# Patient Record
Sex: Female | Born: 1970
Health system: Southern US, Community
[De-identification: ages and names within clinical notes are randomized; demographics above are authoritative.]

## PROBLEM LIST (undated history)

## (undated) DIAGNOSIS — J309 Allergic rhinitis, unspecified: Secondary | ICD-10-CM

## (undated) DIAGNOSIS — D649 Anemia, unspecified: Secondary | ICD-10-CM

## (undated) DIAGNOSIS — E079 Disorder of thyroid, unspecified: Secondary | ICD-10-CM

## (undated) DIAGNOSIS — Z973 Presence of spectacles and contact lenses: Secondary | ICD-10-CM

## (undated) DIAGNOSIS — H9312 Tinnitus, left ear: Secondary | ICD-10-CM

## (undated) DIAGNOSIS — F410 Panic disorder [episodic paroxysmal anxiety] without agoraphobia: Secondary | ICD-10-CM

## (undated) DIAGNOSIS — E559 Vitamin D deficiency, unspecified: Secondary | ICD-10-CM

## (undated) DIAGNOSIS — N920 Excessive and frequent menstruation with regular cycle: Secondary | ICD-10-CM

## (undated) DIAGNOSIS — D500 Iron deficiency anemia secondary to blood loss (chronic): Secondary | ICD-10-CM

## (undated) DIAGNOSIS — K219 Gastro-esophageal reflux disease without esophagitis: Secondary | ICD-10-CM

## (undated) DIAGNOSIS — F411 Generalized anxiety disorder: Secondary | ICD-10-CM

## (undated) DIAGNOSIS — R030 Elevated blood-pressure reading, without diagnosis of hypertension: Secondary | ICD-10-CM

## (undated) DIAGNOSIS — U071 COVID-19: Secondary | ICD-10-CM

## (undated) DIAGNOSIS — F419 Anxiety disorder, unspecified: Secondary | ICD-10-CM

## (undated) DIAGNOSIS — E039 Hypothyroidism, unspecified: Secondary | ICD-10-CM

## (undated) DIAGNOSIS — H9319 Tinnitus, unspecified ear: Secondary | ICD-10-CM

## (undated) DIAGNOSIS — D259 Leiomyoma of uterus, unspecified: Secondary | ICD-10-CM

## (undated) HISTORY — DX: Gastro-esophageal reflux disease without esophagitis: K21.9

## (undated) HISTORY — PX: CERVICAL CERCLAGE: SHX1329

## (undated) HISTORY — DX: Anemia, unspecified: D64.9

## (undated) HISTORY — PX: BREAST BIOPSY: SHX20

## (undated) HISTORY — DX: Vitamin D deficiency, unspecified: E55.9

## (undated) HISTORY — DX: Tinnitus, unspecified ear: H93.19

---

## 1997-11-23 ENCOUNTER — Other Ambulatory Visit: Admission: RE | Admit: 1997-11-23 | Discharge: 1997-11-23 | Payer: Self-pay | Admitting: *Deleted

## 1997-12-24 ENCOUNTER — Other Ambulatory Visit: Admission: RE | Admit: 1997-12-24 | Discharge: 1997-12-24 | Payer: Self-pay | Admitting: Internal Medicine

## 1998-05-20 ENCOUNTER — Other Ambulatory Visit: Admission: RE | Admit: 1998-05-20 | Discharge: 1998-05-20 | Payer: Self-pay | Admitting: *Deleted

## 1999-05-23 ENCOUNTER — Other Ambulatory Visit: Admission: RE | Admit: 1999-05-23 | Discharge: 1999-05-23 | Payer: Self-pay | Admitting: *Deleted

## 2000-08-29 ENCOUNTER — Other Ambulatory Visit: Admission: RE | Admit: 2000-08-29 | Discharge: 2000-08-29 | Payer: Self-pay | Admitting: Obstetrics and Gynecology

## 2000-09-20 ENCOUNTER — Ambulatory Visit (HOSPITAL_COMMUNITY): Admission: RE | Admit: 2000-09-20 | Discharge: 2000-09-20 | Payer: Self-pay | Admitting: Obstetrics and Gynecology

## 2000-09-20 ENCOUNTER — Encounter: Payer: Self-pay | Admitting: Obstetrics and Gynecology

## 2000-11-01 ENCOUNTER — Ambulatory Visit (HOSPITAL_COMMUNITY): Admission: RE | Admit: 2000-11-01 | Discharge: 2000-11-01 | Payer: Self-pay | Admitting: Obstetrics and Gynecology

## 2000-12-14 ENCOUNTER — Encounter (INDEPENDENT_AMBULATORY_CARE_PROVIDER_SITE_OTHER): Payer: Self-pay | Admitting: Specialist

## 2000-12-14 ENCOUNTER — Inpatient Hospital Stay (HOSPITAL_COMMUNITY): Admission: AD | Admit: 2000-12-14 | Discharge: 2001-01-17 | Payer: Self-pay | Admitting: Obstetrics and Gynecology

## 2000-12-19 ENCOUNTER — Encounter: Payer: Self-pay | Admitting: Obstetrics and Gynecology

## 2000-12-26 ENCOUNTER — Encounter: Payer: Self-pay | Admitting: Obstetrics and Gynecology

## 2001-01-03 ENCOUNTER — Encounter: Payer: Self-pay | Admitting: Obstetrics & Gynecology

## 2001-01-13 ENCOUNTER — Encounter: Payer: Self-pay | Admitting: Obstetrics and Gynecology

## 2001-01-18 ENCOUNTER — Encounter: Admission: RE | Admit: 2001-01-18 | Discharge: 2001-02-17 | Payer: Self-pay | Admitting: Obstetrics and Gynecology

## 2001-02-12 ENCOUNTER — Other Ambulatory Visit: Admission: RE | Admit: 2001-02-12 | Discharge: 2001-02-12 | Payer: Self-pay | Admitting: Obstetrics and Gynecology

## 2001-02-18 ENCOUNTER — Encounter: Admission: RE | Admit: 2001-02-18 | Discharge: 2001-03-20 | Payer: Self-pay | Admitting: Obstetrics and Gynecology

## 2001-04-20 ENCOUNTER — Encounter: Admission: RE | Admit: 2001-04-20 | Discharge: 2001-05-20 | Payer: Self-pay | Admitting: Obstetrics and Gynecology

## 2001-05-21 ENCOUNTER — Encounter: Admission: RE | Admit: 2001-05-21 | Discharge: 2001-06-20 | Payer: Self-pay | Admitting: Obstetrics and Gynecology

## 2002-02-14 ENCOUNTER — Other Ambulatory Visit: Admission: RE | Admit: 2002-02-14 | Discharge: 2002-02-14 | Payer: Self-pay | Admitting: Internal Medicine

## 2003-09-03 ENCOUNTER — Encounter: Admission: RE | Admit: 2003-09-03 | Discharge: 2003-09-03 | Payer: Self-pay | Admitting: Internal Medicine

## 2009-11-24 ENCOUNTER — Encounter: Admission: RE | Admit: 2009-11-24 | Discharge: 2009-11-24 | Payer: Self-pay | Admitting: Internal Medicine

## 2010-10-02 ENCOUNTER — Encounter: Payer: Self-pay | Admitting: Internal Medicine

## 2010-10-03 ENCOUNTER — Encounter: Payer: Self-pay | Admitting: Internal Medicine

## 2011-01-04 ENCOUNTER — Other Ambulatory Visit: Payer: Self-pay | Admitting: Internal Medicine

## 2011-01-04 DIAGNOSIS — Z1231 Encounter for screening mammogram for malignant neoplasm of breast: Secondary | ICD-10-CM

## 2011-01-26 ENCOUNTER — Ambulatory Visit: Payer: Self-pay

## 2011-01-27 NOTE — Op Note (Signed)
[facility]  PatientMADGELINE, [name]                 MRN: [id number] Proc. Date: 11/01/00 Adm. Date:  [id number] Disc. Date: [id number] Attending:  Maxie Better                           Operative Report  PREOPERATIVE DIAGNOSES:       Cervical incompetence, intrauterine gestation at                               13-5/7th weeks.  POSTOPERATIVE DIAGNOSES:      Cervical incompetence, intrauterine gestation at                               13-5/17ht weeks.  OPERATION:                    Shirodkar cerclage.  SURGEON:                      Sheronette A. Cherly Hensen, M.D.  ASSISTANT:                    Marina Gravel, M.D.  ANESTHESIA:                   Spinal.  INDICATIONS:                  This is a 40 year old gravida 2, para 0-0-1-0 female with an EDC by ultrasound of May 04, 2001 now at 13-5/7th weeks gestation being admitted for a cerclage placement secondary to cervical incompetence. Cervical incompetence had been diagnosed after the patient had a 2nd trimester loss.  Her history is notable for cryosurgery.  She also has the history of hypothyroidism which is well controlled.  The patient had a group B strep culture done prior to her procedure and this was negative.  She however had a history of a positive group B strep with her previous pregnancy. The risks and benefits of the procedure explained to the patient and consent was signed.  Fetal heart rhythms were auscultated prior to transfer to the operating room.  DESCRIPTION OF PROCEDURE:  Under adequate spinal anesthesia, the patient was placed in the dorsal lithotomy position using the Allen stirrups.  The patient was sterilely prepped and draped and the bladder was catheterized for a large amount of urine. A bivalve speculum was placed in the vagina and the vagina was then prepped with saline solution.  The cervix was notable for the less than a centimeter in length round os with  some granulation tissue around 10 oclock. Using a weighted speculum the cervix was then subsequently grasped by the anterior and posterior lip at the junction of the cervix and the vagina as well as the cervix and the rectum.  An injection of normal saline fluid was used to help to delineate the reflection of the vagina to the bladder area.  A transverse incision was then made at the junction of the cervix and the vagina and was then initially attempted to be displaced superiorly using blunt dissection, however, a Metzenbaum was subsequently used to cause advancement of the cervical vaginal junction anteriorly.  This was also performed posteriorly as well.  After adequate further lengthening of the cervix had  been accomplished with this method, a 5 mm Mersilene tape was placed starting at 10 oclock and in the U-form with a knot being placed anteriorly and a Prolene suture used to help to mark the area of the knot.  The cervix was noted to be closed.  Good cervical length was noted after the procedure.  The patient received ampicillin  perioperatively. The patient tolerated the procedure well and was transferred to the recovery room in stable condition. In the recovery room she received Indomethacin by mouth and fetal heart rate of 168 was noted.  The patient will be discharged home with some ampicillin and Indocin treatment.  Follow-up is in one week at the office.  SPECIMENS:                    None.  COMPLICATIONS:                None. DD:  11/01/00 TD:  11/03/00 Job: [id number]

## 2011-01-27 NOTE — Discharge Summary (Signed)
[facility]  PatientSHERRIKA, [name]                 MRN: [id number] Adm. Date:  [id number] Disc. Date: [id number] Attending:  Maxie Better                           Discharge Summary  ADMISSION DIAGNOSES:          1. Cervical incompetence.                               2. Intrauterine gestation at 19-6/7 weeks.                               3. Hypothyroidism.  DISCHARGE DIAGNOSES:          1. Severe chorioamnionitis.                               2. Preterm gestation, delivered.                               3. Preterm premature rupture of membranes.                               4. Cervical incompetence.                               5. Hypothyroidism.                               6. Iron deficiency anemia.  PROCEDURE:                    Classical cesarean section.  HISTORY OF PRESENT ILLNESS:   This is a 40 year old, gravida 2, para 0-0-1-0, female, at 19-6/[redacted] weeks gestation by ultrasound with a Shirodkar cerclage, admitted for bed rest secondary to severe shortening of the cervix due to cervical incompetence.  The patients history had been notable for a second trimester loss at 21 weeks.  She had a cerclage placement done on November 01, 2000, with followup ultrasound subsequent to the procedure revealing a cervical length of 3.1 cm.  The patient presented for routine obstetrical care and a repeat sonogram showed an internal os dilated to the level of the cerclage and the cervical length of 0.8 cm.  The patients history is notable for cryosurgery.  No history of DES exposure and the antiphospholipid antibody evaluation prior to pregnancy was negative.  The patient had had no contractions and noted good fetal movement.  Anatomic survey had been otherwise normal.  HOSPITAL COURSE:              The patient was admitted with a diagnosis of severely shortened cervix despite cerclage placement.  She was placed in Trendelenburg  position and bed rest.  She was continued on her Synthroid medication.  Urinalysis was obtained which was unremarkable.  The patient was monitored for possible uterine contractions.  There was evidence of rare uterine irritability, however, no contractions noted.  She was discontinued on the continuous monitoring, placed  for contractions q.d. for an hour and continued in Trendelenburg.  She underwent an anatomic ultrasound on December 19, 2000, that had showed a cervical length of 0.8 cm.  Fetal position limited the ability to see the orbits of the face and the outflow tract.  The patient had continued close monitoring and was placed on expectant management.  Fetal heart rate was auscultated generally between 148-152.  The patient had physical therapy consultation due to the prolonged bed rest.  She had a repeat ultrasound to check cervical length on April 17, that showed the cervical length at 1.0 cm.  The cerclage suture was noted and an active fetus.  ______ examination had shown that suture but a minimally palpable cervical length. The patient was consensually continued on this management until hospital day #31.  The day prior on Jan 12, 2001, the patient had noted some contractions which responded to an intravenous fluid bolus.  On Jan 13, 2001, the patient had preterm premature rupture of membranes.  Ultrasound revealed a funic presentation with breech.  The patient was complaining of intermittent low-back pain.  She was afebrile.  Abdomen was gravid, nontender.  Sterile speculum examination revealed a copious fluid, greenish without odor.  Suture was visible.  The cervix appeared not to be open.  Fern positive.  Urinalysis was obtained.  A CBC with differential was also obtained for her possible chorioamnionitis.  The patient was given one dose of betamethasone. Ultrasound had revealed an estimated fetal weight of 78 g which is in the 53% on cor presenting, anterior placenta.  The  initial plan had been to leave the cerclage in place with the ruptured membrane and watch closely for evidence of infection.  However, her CBC revealed a white count of 22.9 with 83 polys, 19 lymphs.  Urine culture was done and subsequently was negative with urine that showed trace blood, nitrite negative and ketones negative.  Her CBC from February of 2002 had showed a white count of 15.1.  The patient was transferred to labor and delivery for close monitoring.  She still had remained afebrile at that time.  On palpation with a digital examination, the cord was palpable.  The pull-through suture of the cerclage breech which required an emergent cesarean section.  The patient was transferred to the operating room where she underwent a classical cesarean section and removal of her cerclage.  Anterior placenta was noted.  A live female was delivered, breech position, Apgars of 4, 6, and 7.  Normal tubes and ovaries were noted.  The placenta was spontaneous, intact with a final pathology consistent with severe chorioamnionitis.  The cervix was inspected and no lacerations noted.  The baby was transferred in ICU.  The weight of the baby was 659 grams.  The patient was transferred to the postpartum floor.  She remained afebrile throughout her postoperative course.  Her CBC on postop day #1 showed a hemoglobin of 10.1, hematocrit of 29.4, white count was 25.7.  Repeat CBC on postop day #3 showed a white count down to 16.1, hemoglobin 7.9, hematocrit of 23.  The patient had been asymptomatic despite the low hemoglobin.  By postop day #4 a repeat CBC showed a hemoglobin of 8.7, hematocrit of 25.9, a white count of 16.6.  The patient remained without headache or dizziness.  Her incision was without any erythema, induration or exudate.  She was tolerating a regular diet and had remained afebrile, and therefore deemed well to be discharged home.  DISPOSITION:  Home.  CONDITION ON  DISCHARGE:       Stable.  DISCHARGE MEDICATIONS:        1. Prenatal vitamins 1 p.o. q.d.                               2. Iron supplementation 25 mg 1 p.o. b.i.d.                                3. Tylox 1-2 tablets q.3-4h. p.r.n. pain.                               4. Motrin 600 mg 1 q.6h. p.r.n. pain.  DISCHARGE INSTRUCTIONS:       Call for temperature greater or equal to 100.4. Nothing per vagina for 4-6 weeks.  No heavy lifting nor driving for 2 weeks. Call if incisional drainage, redness, increased incisional pain, severe abdominal pain, nausea or vomiting.  Baby remains in the NICU.  Followup appointment is at 4 weeks at [facility]. DD:  02/20/01 TD:  02/20/01 Job: [id number]

## 2011-01-27 NOTE — Op Note (Signed)
[facility]  PatientDAUN, RENS                 MRN: [id number] Proc. Date: 01/13/01 Adm. Date:  [id number] Attending:  Maxie Better                           Operative Report  PREOPERATIVE DIAGNOSES:         1. Preterm premature rupture of membranes.                                 2. Cervical incompetence.                                 3. Breech/funic presentation.                                 4. Intrauterine gestation at 24-1/7                                    weeks.  POSTOPERATIVE DIAGNOSES:        1. Preterm premature rupture of membranes.                                 2. Intrauterine gestation at 24-1/7 weeks.                                 3. Cervical incompetence.                                 4. Breech/funic presentation.  OPERATION:                      Primary cesarean section, classical hysterotomy, removal of cerclage.  SURGEON:                        Sheronette A. Cherly Hensen, M.D.  ASSISTANT:                      Jamey Reas, M.D.  ANESTHESIA:                     General.  INDICATIONS:                    This is a 40 year old gravida 2, para 1-0-1-0 at 0-0-1-0 female 24-1/[redacted] weeks gestation with a cerclage admitted to the hospital since December 14, 2000, secondary to severe shortening of the her cervix with the cerclage in situ who had spontaneous rupture of membranes at 5:10 on Jan 13, 2001.  The patient was evaluated, confirmed rupture of membranes with fern testing.  An ultrasound was done at the bedside by the radiology department and showed a breech presentation with cord presenting, and cerclage in the cervix at approximately 1 cm.  The estimated fetal weight was 670 g.  Anterior placenta.  The patient had been complaining of some low back pain and was felt  to be having some contractions.  She had been afebrile. Abdomen was gravid and nontender.  Tracing did not show the  contractions. Straight catheterization urinalysis was obtained which showed only trace blood.  The CBC with differential was obtained which showed a white count of 22.5 with 83 polys and 11 lymphs.  Group B strep culture which has been negative has been negative at the time of her cerclage placement was reperformed.  The patient was restarted on magnesium sulfate, given one dose of betamethasone and was transferred to labor and delivery with the plan to have magnesium sulfate until the completion of her betamethasone and to watch for evidence of chorioamnionitis.  On transfer to labor and delivery, the patient was examined by digital and was noted to have pull through of the cerclage with a palpable cord and fetal part.  Based on the findings, decision was made to proceed with a primary cesarean section.  The patient had been seen by the neonatologist at around 23+ weeks and it was fully understand the implications of her severe prematurity.  Risks and benefits of the cesarean section have been discussed with the patient and the patient was emergently transferred to the operating room.  DESCRIPTION OF PROCEDURE:     Under adequate epidural anesthesia, the patient was placed in the supine position with a left lateral tilt.  An indwelling Foley catheter had been in place prior.  The patient was prepped and draped with the induction of general anesthesia.  A Pfannenstiel incision was then quickly made, carried down to the rectus fascia.  The rectus fascia was incised in the midline and extended bilaterally.  The rectus fascia was then bluntly and sharply dissected off the rectus muscles in a superior and inferior fashion.  The rectus muscle were split in the midline.  The parietal peritoneum was bluntly entered.  A vertical uterine incision was then made and subsequent delivery of a live female infant in the breech position was accomplished.  The cord was clamped and cut.  The baby was  transferred to the awaiting pediatrician who quickly transferred the baby to the neonatal intensive care unit.  Apgars of 4, 6 and 7 were subsequently assigned at 1, 5 and 10 minutes.  The placenta was spontaneously intact and sent to pathology. The uterine cavity was cleaned of debris.  The uterus was exteriorized. Normal tubes and ovaries were noted bilaterally.  It was notable for the uterus to be atonic with very thin musculature.  The uterine cavity was cleaned of debris.  The uterine incision was closed with a running locked stitch of 0 Monocryl.  The second layer was imbricated using 0 Monocryl and 2-0 Monocryl was used to do baseball closure of the serosa.  Inferiorly, there was evidence of bleed, and the figure-of-eight suture was placed.  The uterus was then returned to the abdomen and pericolic gutters were cleaned. Inspection of the incision showed good hemostasis.  The parietal peritoneum was closed.  The rectus fascia was inspected.  Small bleeders were cauterized. The rectus fascia was closed with Vicryl x 2.  The subcutaneous areas were irrigated.  Small bleeders were cauterized and subcuticular areas injected with 0.25% Marcaine and approximated using Ethicon staples.  Attention was then turned to the vagina.  The patient was placed in the [place] stirrups.  A large Graves speculum was placed in the vagina.  The blood was removed from the vagina.  The anterior knot of the Mersilene band was identified and cut and  removed.  The cervix appeared to be at least 2 cm dilated and using a ring clamp, the cervix was serially inspected.  No evidence of a laceration was noted, and therefore a large clot was removed from the os and the instruments were removed from the vagina.  Specimen:  Placenta sent to pathology. Estimated blood loss was 800 cc.  Urine output was 400 cc clear, yellow urine, Intraoperative blood loss was 2 liters.  The baby was transferred to the intensive care unit.   Mom was transferred to the recovery room stable. Sponge and instrument counts x 2 were correct.  Complications none. DD:  01/13/01 TD:  01/14/01 Job: [id number]

## 2011-04-13 ENCOUNTER — Ambulatory Visit
Admission: RE | Admit: 2011-04-13 | Discharge: 2011-04-13 | Disposition: A | Discharge status: Home / Self Care | Payer: BC Managed Care – PPO | Source: Ambulatory Visit | Attending: Internal Medicine | Admitting: Internal Medicine

## 2011-04-13 DIAGNOSIS — Z1231 Encounter for screening mammogram for malignant neoplasm of breast: Secondary | ICD-10-CM

## 2011-10-27 ENCOUNTER — Encounter (HOSPITAL_BASED_OUTPATIENT_CLINIC_OR_DEPARTMENT_OTHER): Payer: Self-pay | Admitting: *Deleted

## 2011-10-27 ENCOUNTER — Other Ambulatory Visit: Payer: Self-pay

## 2011-10-27 ENCOUNTER — Emergency Department (HOSPITAL_BASED_OUTPATIENT_CLINIC_OR_DEPARTMENT_OTHER)
Admission: EM | Admit: 2011-10-27 | Discharge: 2011-10-28 | Disposition: A | Discharge status: Home / Self Care | Payer: BC Managed Care – PPO | Attending: Emergency Medicine | Admitting: Emergency Medicine

## 2011-10-27 DIAGNOSIS — E079 Disorder of thyroid, unspecified: Secondary | ICD-10-CM | POA: Insufficient documentation

## 2011-10-27 DIAGNOSIS — F411 Generalized anxiety disorder: Secondary | ICD-10-CM | POA: Insufficient documentation

## 2011-10-27 DIAGNOSIS — F419 Anxiety disorder, unspecified: Secondary | ICD-10-CM

## 2011-10-27 DIAGNOSIS — R0789 Other chest pain: Secondary | ICD-10-CM | POA: Insufficient documentation

## 2011-10-27 HISTORY — DX: Panic disorder (episodic paroxysmal anxiety): F41.0

## 2011-10-27 HISTORY — DX: Anxiety disorder, unspecified: F41.9

## 2011-10-27 HISTORY — DX: Disorder of thyroid, unspecified: E07.9

## 2011-10-27 NOTE — ED Notes (Signed)
Palpatations x 4 years. Pain in the left side of her neck x 1 week. States she has felt anxious all day today.

## 2011-10-28 LAB — CARDIAC PANEL(CRET KIN+CKTOT+MB+TROPI)
CK, MB: 1.6 ng/mL (ref 0.3–4.0)
Relative Index: 1.2 (ref 0.0–2.5)
Total CK: 134 U/L (ref 7–177)

## 2011-10-28 LAB — DIFFERENTIAL
Basophils Absolute: 0 10*3/uL (ref 0.0–0.1)
Eosinophils Absolute: 0.3 10*3/uL (ref 0.0–0.7)
Eosinophils Relative: 3 % (ref 0–5)
Lymphocytes Relative: 31 % (ref 12–46)
Neutrophils Relative %: 56 % (ref 43–77)

## 2011-10-28 LAB — BASIC METABOLIC PANEL
CO2: 26 mEq/L (ref 19–32)
Calcium: 9.9 mg/dL (ref 8.4–10.5)
Creatinine, Ser: 0.8 mg/dL (ref 0.50–1.10)
GFR calc non Af Amer: >90 mL/min (ref >90)
Sodium: 137 mEq/L (ref 135–145)

## 2011-10-28 LAB — CBC
MCH: 29.7 pg (ref 26.0–34.0)
MCV: 86.2 fL (ref 78.0–100.0)
Platelets: 316 10*3/uL (ref 150–400)
RBC: 4.27 MIL/uL (ref 3.87–5.11)
RDW: 12.6 % (ref 11.5–15.5)
WBC: 9.6 10*3/uL (ref 4.0–10.5)

## 2011-10-28 MED ORDER — ACETAMINOPHEN 325 MG PO TABS
ORAL_TABLET | ORAL | Status: AC
  Administered 2011-10-28: 650 mg
  Filled 2011-10-28: qty 2

## 2011-10-28 MED ORDER — LORAZEPAM 1 MG PO TABS: 1.0000 mg | ORAL_TABLET | Freq: Three times a day (TID) | ORAL | Status: AC | PRN

## 2011-10-28 NOTE — ED Provider Notes (Signed)
History     CSN: [id number]  Arrival date & time 10/27/11  2248   First MD Initiated Contact with Patient 10/28/11 0041      Chief Complaint  Patient presents with  . Chest tightness and anxiety     (Consider location/radiation/quality/duration/timing/severity/associated sxs/prior treatment) HPI This is a 41 year old black female with a history of anxiety. She also is a 4 year history of hearing her heart beating in her left ear. She is here complaining of episodic anxiety with chest tightness throughout the day yesterday. There did not seem to be any exacerbating or mitigating factors. It was not worse with exertion nor improved with rest. She denies hyperventilation, paresthesias or frank chest pain. She thought it might be indigestion at first. It was associated with an increase in the left ear tinnitus. The left ear tinnitus returned to its baseline level about half an hour ago. While he was worse she was having difficulty hearing. Her symptoms peaked about 8 PM yesterday. At the present time she is asymptomatic.  Past Medical History  Diagnosis Date  . Anxiety   . Panic attack   . Thyroid disease     History reviewed. No pertinent past surgical history.  No family history on file.  History  Substance Use Topics  . Smoking status: Never Smoker   . Smokeless tobacco: Not on file  . Alcohol Use: No    OB History    Grav Para Term Preterm Abortions TAB SAB Ect Mult Living                  Review of Systems  All other systems reviewed and are negative.    Allergies  Review of patient's allergies indicates no known allergies.  Home Medications   Current Outpatient Rx  Name Route Sig Dispense Refill  . AMOXICILLIN-POT CLAVULANATE 875-125 MG PO TABS Oral Take 1 tablet by mouth 2 (two) times daily.    Marland Kitchen VITAMIN D 1000 UNITS PO TABS Oral Take 1,000 Units by mouth daily.    . ERYTHROMYCIN BASE 333 MG PO TBEC Oral Take 333 mg by mouth daily.    Marland Kitchen LEVOTHYROXINE SODIUM  25 MCG PO TABS Oral Take 25 mcg by mouth daily.    . ADULT MULTIVITAMIN W/MINERALS CH Oral Take 1 tablet by mouth daily.    Marland Kitchen PROBIOTIC FORMULA PO Oral Take 1 tablet by mouth daily.      BP 140/87  Pulse 89  Temp(Src) 97.9 F (36.6 C) (Oral)  Resp 22  SpO2 100%  Physical Exam General: Well-developed, well-nourished female in no acute distress; appearance consistent with age of record HENT: normocephalic, atraumatic; TMs of normal appearance Eyes: pupils equal round and reactive to light; extraocular muscles intact Neck: supple Heart: regular rate and rhythm; no murmurs Lungs: clear to auscultation bilaterally Abdomen: soft; nondistended; nontender; bowel sounds present Extremities: No deformity; full range of motion; pulses normal Neurologic: Awake, alert and oriented; motor function intact in all extremities and symmetric; no facial droop Skin: Warm and dry Psychiatric: Normal mood and affect    ED Course  Procedures (including critical care time)     MDM   Date: 10/27/2011  Rate: 84  Rhythm: normal sinus rhythm  QRS Axis: normal  Intervals: normal  ST/T Wave abnormalities: normal  Conduction Disutrbances: none  Narrative Interpretation:   Old EKG Reviewed: None available  Nursing notes and vitals signs, including pulse oximetry, reviewed.  Summary of this visit's results, reviewed by myself:  Labs:  Results for orders placed during the hospital encounter of 10/27/11  CARDIAC PANEL(CRET KIN+CKTOT+MB+TROPI)      Component Value Range   Total CK 134  7 - 177 (U/L)   CK, MB 1.6  0.3 - 4.0 (ng/mL)   Troponin I <0.30  <0.30 (ng/mL)   Relative Index 1.2  0.0 - 2.5   BASIC METABOLIC PANEL      Component Value Range   Sodium 137  135 - 145 (mEq/L)   Potassium 4.5  3.5 - 5.1 (mEq/L)   Chloride 102  96 - 112 (mEq/L)   CO2 26  19 - 32 (mEq/L)   Glucose, Bld 97  70 - 99 (mg/dL)   BUN 11  6 - 23 (mg/dL)   Creatinine, Ser 1.19  0.50 - 1.10 (mg/dL)   Calcium 9.9   8.4 - 10.5 (mg/dL)   GFR calc non Af Amer >90  >90 (mL/min)   GFR calc Af Amer >90  >90 (mL/min)  CBC      Component Value Range   WBC 9.6  4.0 - 10.5 (K/uL)   RBC 4.27  3.87 - 5.11 (MIL/uL)   Hemoglobin 12.7  12.0 - 15.0 (g/dL)   HCT 14.7  82.9 - 56.2 (%)   MCV 86.2  78.0 - 100.0 (fL)   MCH 29.7  26.0 - 34.0 (pg)   MCHC 34.5  30.0 - 36.0 (g/dL)   RDW 13.0  86.5 - 78.4 (%)   Platelets 316  150 - 400 (K/uL)  DIFFERENTIAL      Component Value Range   Neutrophils Relative 56  43 - 77 (%)   Neutro Abs 5.4  1.7 - 7.7 (K/uL)   Lymphocytes Relative 31  12 - 46 (%)   Lymphs Abs 3.0  0.7 - 4.0 (K/uL)   Monocytes Relative 10  3 - 12 (%)   Monocytes Absolute 0.9  0.1 - 1.0 (K/uL)   Eosinophils Relative 3  0 - 5 (%)   Eosinophils Absolute 0.3  0.0 - 0.7 (K/uL)   Basophils Relative 0  0 - 1 (%)   Basophils Absolute 0.0  0.0 - 0.1 (K/uL)   2:26 AM Asymptomatic. Symptomatology more consistent with anxiety. Patient was advised to return for worsening or changing symptoms.              Hanley Seamen, MD 10/28/11 [phone]

## 2011-10-28 NOTE — ED Notes (Signed)
Pt denies CP, SOB states she has mild tightness in her chest which she thinks is related to a panic attack

## 2011-10-28 NOTE — Discharge Instructions (Signed)

## 2012-04-30 ENCOUNTER — Other Ambulatory Visit: Payer: Self-pay | Admitting: Internal Medicine

## 2012-04-30 DIAGNOSIS — Z1231 Encounter for screening mammogram for malignant neoplasm of breast: Secondary | ICD-10-CM

## 2012-05-30 ENCOUNTER — Ambulatory Visit: Payer: BC Managed Care – PPO

## 2012-06-11 ENCOUNTER — Ambulatory Visit: Payer: BC Managed Care – PPO

## 2012-10-22 ENCOUNTER — Ambulatory Visit
Admission: RE | Admit: 2012-10-22 | Discharge: 2012-10-22 | Disposition: A | Discharge status: Home / Self Care | Payer: BC Managed Care – PPO | Source: Ambulatory Visit | Attending: Internal Medicine | Admitting: Internal Medicine

## 2012-10-22 DIAGNOSIS — Z1231 Encounter for screening mammogram for malignant neoplasm of breast: Secondary | ICD-10-CM

## 2012-12-17 ENCOUNTER — Ambulatory Visit: Payer: Self-pay | Admitting: Nurse Practitioner

## 2013-11-10 ENCOUNTER — Other Ambulatory Visit: Payer: Self-pay

## 2013-11-10 DIAGNOSIS — Z1231 Encounter for screening mammogram for malignant neoplasm of breast: Secondary | ICD-10-CM

## 2013-11-26 ENCOUNTER — Ambulatory Visit: Payer: BC Managed Care – PPO

## 2014-01-09 ENCOUNTER — Ambulatory Visit: Payer: BC Managed Care – PPO

## 2014-02-12 ENCOUNTER — Encounter (INDEPENDENT_AMBULATORY_CARE_PROVIDER_SITE_OTHER): Payer: Self-pay

## 2014-02-12 ENCOUNTER — Ambulatory Visit
Admission: RE | Admit: 2014-02-12 | Discharge: 2014-02-12 | Disposition: A | Discharge status: Home / Self Care | Payer: 59 | Source: Ambulatory Visit

## 2014-02-12 DIAGNOSIS — Z1231 Encounter for screening mammogram for malignant neoplasm of breast: Secondary | ICD-10-CM

## 2015-01-14 ENCOUNTER — Ambulatory Visit: Payer: 59 | Admitting: Neurology

## 2015-01-14 ENCOUNTER — Telehealth: Payer: Self-pay

## 2015-01-14 NOTE — Telephone Encounter (Signed)
Patient did not come to appt today

## 2015-01-25 ENCOUNTER — Encounter: Payer: Self-pay | Admitting: Neurology

## 2016-03-13 ENCOUNTER — Other Ambulatory Visit: Payer: Self-pay | Admitting: Internal Medicine

## 2016-03-13 DIAGNOSIS — Z1231 Encounter for screening mammogram for malignant neoplasm of breast: Secondary | ICD-10-CM

## 2016-03-22 ENCOUNTER — Ambulatory Visit
Admission: RE | Admit: 2016-03-22 | Discharge: 2016-03-22 | Disposition: A | Discharge status: Home / Self Care | Payer: BLUE CROSS/BLUE SHIELD | Source: Ambulatory Visit | Attending: Internal Medicine | Admitting: Internal Medicine

## 2016-03-22 DIAGNOSIS — Z1231 Encounter for screening mammogram for malignant neoplasm of breast: Secondary | ICD-10-CM

## 2016-03-27 ENCOUNTER — Other Ambulatory Visit: Payer: Self-pay | Admitting: Internal Medicine

## 2016-03-27 DIAGNOSIS — R928 Other abnormal and inconclusive findings on diagnostic imaging of breast: Secondary | ICD-10-CM

## 2016-03-31 ENCOUNTER — Other Ambulatory Visit: Payer: BLUE CROSS/BLUE SHIELD

## 2016-04-10 ENCOUNTER — Other Ambulatory Visit: Payer: Self-pay | Admitting: Internal Medicine

## 2016-04-10 ENCOUNTER — Ambulatory Visit
Admission: RE | Admit: 2016-04-10 | Discharge: 2016-04-10 | Disposition: A | Discharge status: Home / Self Care | Payer: BLUE CROSS/BLUE SHIELD | Source: Ambulatory Visit | Attending: Internal Medicine | Admitting: Internal Medicine

## 2016-04-10 DIAGNOSIS — N6310 Unspecified lump in the right breast, unspecified quadrant: Secondary | ICD-10-CM

## 2016-04-10 DIAGNOSIS — R928 Other abnormal and inconclusive findings on diagnostic imaging of breast: Secondary | ICD-10-CM

## 2016-04-14 ENCOUNTER — Other Ambulatory Visit: Payer: Self-pay | Admitting: Internal Medicine

## 2016-04-14 ENCOUNTER — Ambulatory Visit
Admission: RE | Admit: 2016-04-14 | Discharge: 2016-04-14 | Disposition: A | Discharge status: Home / Self Care | Payer: BLUE CROSS/BLUE SHIELD | Source: Ambulatory Visit | Attending: Internal Medicine | Admitting: Internal Medicine

## 2016-04-14 DIAGNOSIS — N6310 Unspecified lump in the right breast, unspecified quadrant: Secondary | ICD-10-CM

## 2016-04-14 DIAGNOSIS — N63 Unspecified lump in breast: Secondary | ICD-10-CM | POA: Diagnosis not present

## 2016-09-05 DIAGNOSIS — H16223 Keratoconjunctivitis sicca, not specified as Sjogren's, bilateral: Secondary | ICD-10-CM | POA: Diagnosis not present

## 2016-09-05 DIAGNOSIS — H527 Unspecified disorder of refraction: Secondary | ICD-10-CM | POA: Diagnosis not present

## 2016-12-04 ENCOUNTER — Encounter: Payer: Self-pay | Admitting: Family Medicine

## 2016-12-04 ENCOUNTER — Ambulatory Visit (INDEPENDENT_AMBULATORY_CARE_PROVIDER_SITE_OTHER): Payer: 59 | Admitting: Family Medicine

## 2016-12-04 VITALS — BP 118/66 | HR 86 | Temp 98.8°F | Resp 14 | Ht 62.0 in | Wt 221.0 lb

## 2016-12-04 DIAGNOSIS — K219 Gastro-esophageal reflux disease without esophagitis: Secondary | ICD-10-CM | POA: Diagnosis not present

## 2016-12-04 DIAGNOSIS — E039 Hypothyroidism, unspecified: Secondary | ICD-10-CM | POA: Diagnosis not present

## 2016-12-04 DIAGNOSIS — Z0000 Encounter for general adult medical examination without abnormal findings: Secondary | ICD-10-CM

## 2016-12-04 DIAGNOSIS — Z113 Encounter for screening for infections with a predominantly sexual mode of transmission: Secondary | ICD-10-CM

## 2016-12-04 DIAGNOSIS — H9319 Tinnitus, unspecified ear: Secondary | ICD-10-CM | POA: Insufficient documentation

## 2016-12-04 DIAGNOSIS — Z23 Encounter for immunization: Secondary | ICD-10-CM | POA: Diagnosis not present

## 2016-12-04 DIAGNOSIS — Z1321 Encounter for screening for nutritional disorder: Secondary | ICD-10-CM

## 2016-12-04 DIAGNOSIS — Z124 Encounter for screening for malignant neoplasm of cervix: Secondary | ICD-10-CM

## 2016-12-04 DIAGNOSIS — J3089 Other allergic rhinitis: Secondary | ICD-10-CM | POA: Diagnosis not present

## 2016-12-04 DIAGNOSIS — J309 Allergic rhinitis, unspecified: Secondary | ICD-10-CM | POA: Insufficient documentation

## 2016-12-04 DIAGNOSIS — E669 Obesity, unspecified: Secondary | ICD-10-CM | POA: Insufficient documentation

## 2016-12-04 DIAGNOSIS — H9313 Tinnitus, bilateral: Secondary | ICD-10-CM

## 2016-12-04 LAB — LIPID PANEL
CHOL/HDL RATIO: 2.4 ratio (ref <5.0)
CHOLESTEROL: 146 mg/dL (ref <200)
HDL: 61 mg/dL (ref >50)
LDL Cholesterol: 72 mg/dL (ref <100)
TRIGLYCERIDES: 64 mg/dL (ref <150)
VLDL: 13 mg/dL (ref <30)

## 2016-12-04 LAB — CBC WITH DIFFERENTIAL/PLATELET
BASOS ABS: 65 {cells}/uL (ref 0–200)
Basophils Relative: 1 %
EOS ABS: 195 {cells}/uL (ref 15–500)
Eosinophils Relative: 3 %
HEMATOCRIT: 37.9 % (ref 35.0–45.0)
Hemoglobin: 12.7 g/dL (ref 12.0–15.0)
LYMPHS PCT: 34 %
Lymphs Abs: 2210 cells/uL (ref 850–3900)
MCH: 29.5 pg (ref 27.0–33.0)
MCHC: 33.5 g/dL (ref 32.0–36.0)
MCV: 87.9 fL (ref 80.0–100.0)
MONO ABS: 650 {cells}/uL (ref 200–950)
MONOS PCT: 10 %
MPV: 9.5 fL (ref 7.5–12.5)
Neutro Abs: 3380 cells/uL (ref 1500–7800)
Neutrophils Relative %: 52 %
Platelets: 349 10*3/uL (ref 140–400)
RBC: 4.31 MIL/uL (ref 3.80–5.10)
RDW: 13.4 % (ref 11.0–15.0)
WBC: 6.5 10*3/uL (ref 3.8–10.8)

## 2016-12-04 LAB — COMPREHENSIVE METABOLIC PANEL
ALT: 18 U/L (ref 6–29)
AST: 17 U/L (ref 10–35)
Albumin: 3.8 g/dL (ref 3.6–5.1)
Alkaline Phosphatase: 57 U/L (ref 33–115)
BUN: 9 mg/dL (ref 7–25)
CALCIUM: 9.5 mg/dL (ref 8.6–10.2)
CHLORIDE: 106 mmol/L (ref 98–110)
CO2: 24 mmol/L (ref 20–31)
Creat: 0.8 mg/dL (ref 0.50–1.10)
GLUCOSE: 83 mg/dL (ref 70–99)
POTASSIUM: 4.4 mmol/L (ref 3.5–5.3)
Sodium: 139 mmol/L (ref 135–146)
Total Bilirubin: 0.4 mg/dL (ref 0.2–1.2)
Total Protein: 6.7 g/dL (ref 6.1–8.1)

## 2016-12-04 LAB — TSH: TSH: 0.93 m[IU]/L

## 2016-12-04 LAB — T4, FREE: Free T4: 1.1 ng/dL (ref 0.8–1.8)

## 2016-12-04 LAB — T3, FREE: T3, Free: 2.9 pg/mL (ref 2.3–4.2)

## 2016-12-04 LAB — WET PREP FOR TRICH, YEAST, CLUE
Clue Cells Wet Prep HPF POC: NONE SEEN
Trich, Wet Prep: NONE SEEN
YEAST WET PREP: NONE SEEN

## 2016-12-04 NOTE — Patient Instructions (Addendum)
Release of records- Dr. Garwin Brothers Release of records- [facility]- Dr. Arnette Norris I recommend eye visit once a year I recommend dental visit every 6 months Goal is to  Exercise 30 minutes 5 days a week We will call with results  Schedule mammogram for August F/U 6 months

## 2016-12-04 NOTE — Assessment & Plan Note (Signed)
Continue flonase and anti-histamines as needed

## 2016-12-04 NOTE — Assessment & Plan Note (Signed)
Discussed low carb, healthy fats Restart exercise program Check fasting labs and lipids today

## 2016-12-04 NOTE — Assessment & Plan Note (Signed)
Check thyroid function studies

## 2016-12-04 NOTE — Progress Notes (Signed)
Subjective:    Patient ID: [name], female    DOB: Feb 06, 1971, 46 y.o.   MRN: [id number]  Patient presents for New Patient CPE w/ PAP (is fasting) Patient here to establish care. Previous PCP- Dr. Baird [facility] GYN- Dr. Garwin Brothers  Due for PAP Smear    Chronic sinusitis - uses flonase,uses claritin or claritin d as needed    Hypothyrodism- history of Goiter, chronic tinnitus- has had MRI no concerns had seen ENTx3,  No hearing problems   No surgerical internvetion     Pregnant twice /lost of both children -The last one was at age 65 months about 15 years ago due to incompetent cervix and it seems like her body was rejecting the pregnancy. She still has regular menstrual cycle last one was 2 weeks ago. Did have cerclage placed in her cervix still has some scarring.  Last set of labs 2 years ago    [place] nexium OTC, tries to avoid beef and pork, greasy foods cause some soft stools   History of abnormal Mammogram, has tags in right breast ,had biopsy x 2, both benign   Obesity her lowest adult weight 165 pounds. She admits to an eating very healthy and stress eating at times she is looking for a new position in life is busy at home. She has not been working out with plans to restart.  Works as Retail banker   [place]:  GEN- denies fatigue, fever, weight loss,weakness, recent illness HEENT- denies eye drainage, change in vision, nasal discharge, CVS- denies chest pain, palpitations RESP- denies SOB, cough, wheeze ABD- denies N/V, change in stools, abd pain GU- denies dysuria, hematuria, dribbling, incontinence MSK- denies joint pain, muscle aches, injury Neuro- denies headache, dizziness, syncope, seizure activity       Objective:    BP 118/66   Pulse 86   Temp 98.8 F (37.1 C) (Oral)   Resp 14   Ht 5\' 2"  (1.575 m)   Wt 221 lb (100.2 kg)   LMP 11/20/2016 (Approximate) Comment: regular  SpO2 98%   BMI 40.42 kg/m  GEN- NAD,  alert and oriented x3 HEENT- PERRL, EOMI, non injected sclera, pink conjunctiva, MMM, oropharynx clear Neck- Supple, no thyromegaly CVS- RRR, no murmur Breast- normal symmetry, no nipple inversion,no nipple drainage, no nodules or lumps felt Nodes- no axillary nodes RESP-CTAB ABD-NABS,soft,NT,ND GU- normal external genitalia, vaginal mucosa pink and moist, cervix visualized no growth, +blood form os, minimal thin clear discharge, no CMT, no ovarian masses, uterus normal size EXT- No edema Pulses- Radial, DP- 2+        Assessment & Plan:      Problem List Items Addressed This Visit    Tinnitus   Morbid obesity ([place])    Discussed low carb, healthy fats Restart exercise program Check fasting labs and lipids today       Hypothyroidism    Check thyroid function studies       Relevant Orders   TSH   T3, free   T4, free   GERD (gastroesophageal reflux disease)    Continue prn nexium      Chronic allergic rhinitis    Continue flonase and anti-histamines as needed       Other Visit Diagnoses    Routine general medical examination at a health care facility    -  Primary   CPE done, fasting labs, immunizations UTD, obtain records, PAP Smear done, had some bleeding, had scarring on cervix  Relevant Orders   CBC with Differential/Platelet   Comprehensive metabolic panel   Lipid panel   Tdap vaccine greater than or equal to 7yo IM (Completed)   Cervical cancer screening       Relevant Orders   PAP, ThinPrep ASCUS Rflx HPV Rflx Type   Screen for STD (sexually transmitted disease)       Relevant Orders   GC/Chlamydia Probe Amp   WET PREP FOR TRICH, YEAST, CLUE   HIV antibody   Encounter for vitamin deficiency screening       Relevant Orders   Vitamin D, 25-hydroxy   Need for diphtheria-tetanus-pertussis (Tdap) vaccine, adult/adolescent       Relevant Orders   Tdap vaccine greater than or equal to 7yo IM (Completed)      Note: This dictation was prepared with  Dragon dictation along with smaller phrase technology. Any transcriptional errors that result from this process are unintentional.

## 2016-12-04 NOTE — Assessment & Plan Note (Signed)
Continue prn nexium

## 2016-12-05 LAB — GC/CHLAMYDIA PROBE AMP
CT PROBE, AMP APTIMA: NOT DETECTED
GC Probe RNA: NOT DETECTED

## 2016-12-05 LAB — HIV ANTIBODY (ROUTINE TESTING W REFLEX): HIV: NONREACTIVE

## 2016-12-05 LAB — VITAMIN D 25 HYDROXY (VIT D DEFICIENCY, FRACTURES): Vit D, 25-Hydroxy: 28 ng/mL — ABNORMAL LOW (ref 30–100)

## 2016-12-06 LAB — PAP THINPREP ASCUS RFLX HPV RFLX TYPE

## 2016-12-07 ENCOUNTER — Other Ambulatory Visit: Payer: Self-pay | Admitting: *Deleted

## 2016-12-07 MED ORDER — FLUTICASONE PROPIONATE 50 MCG/ACT NA SUSP: 2.0000 | Freq: Every day | NASAL | 3 refills | Status: DC

## 2016-12-07 MED ORDER — LEVOTHYROXINE SODIUM 25 MCG PO TABS: 25.0000 ug | ORAL_TABLET | Freq: Every day | ORAL | 1 refills | Status: DC

## 2016-12-07 MED FILL — LEVOTHYROXINE 25 MCG TABLET: 25 | 90 days supply | Qty: 90 | Fill #0

## 2016-12-07 MED FILL — FLUTICASONE PROP 50 MCG SPR: 50 | 90 days supply | Qty: 48 | Fill #0

## 2016-12-07 NOTE — Telephone Encounter (Signed)
Received call from patient.   Requested prescription for Flonase.   Prescription sent to pharmacy.

## 2017-05-01 ENCOUNTER — Other Ambulatory Visit: Payer: Self-pay | Admitting: Family Medicine

## 2017-05-01 DIAGNOSIS — Z1231 Encounter for screening mammogram for malignant neoplasm of breast: Secondary | ICD-10-CM

## 2017-05-10 ENCOUNTER — Ambulatory Visit: Payer: BLUE CROSS/BLUE SHIELD

## 2017-05-25 ENCOUNTER — Ambulatory Visit
Admission: RE | Admit: 2017-05-25 | Discharge: 2017-05-25 | Disposition: A | Discharge status: Home / Self Care | Payer: Self-pay | Source: Ambulatory Visit | Attending: Family Medicine | Admitting: Family Medicine

## 2017-05-25 DIAGNOSIS — Z1231 Encounter for screening mammogram for malignant neoplasm of breast: Secondary | ICD-10-CM | POA: Diagnosis not present

## 2017-06-04 ENCOUNTER — Encounter: Payer: Self-pay | Admitting: Family Medicine

## 2017-06-04 MED FILL — FLUTICASONE PROP 50 MCG SPR: 50 | 90 days supply | Qty: 48 | Fill #1

## 2017-06-06 ENCOUNTER — Ambulatory Visit (INDEPENDENT_AMBULATORY_CARE_PROVIDER_SITE_OTHER): Payer: 59 | Admitting: Family Medicine

## 2017-06-06 ENCOUNTER — Encounter: Payer: Self-pay | Admitting: Family Medicine

## 2017-06-06 VITALS — BP 112/62 | HR 100 | Temp 98.6°F | Resp 14 | Ht 62.0 in | Wt 221.0 lb

## 2017-06-06 DIAGNOSIS — G4730 Sleep apnea, unspecified: Secondary | ICD-10-CM | POA: Diagnosis not present

## 2017-06-06 DIAGNOSIS — K219 Gastro-esophageal reflux disease without esophagitis: Secondary | ICD-10-CM

## 2017-06-06 DIAGNOSIS — E039 Hypothyroidism, unspecified: Secondary | ICD-10-CM | POA: Diagnosis not present

## 2017-06-06 DIAGNOSIS — R0789 Other chest pain: Secondary | ICD-10-CM

## 2017-06-06 DIAGNOSIS — R609 Edema, unspecified: Secondary | ICD-10-CM

## 2017-06-06 MED ORDER — HYDROCHLOROTHIAZIDE 12.5 MG PO TABS: 12.5000 mg | ORAL_TABLET | Freq: Every day | ORAL | 1 refills | Status: DC | PRN

## 2017-06-06 NOTE — Assessment & Plan Note (Signed)
Can use zantac at bedtime, nexium if severe She was concerned about long term effects of the PPI Discussed foods to avoid

## 2017-06-06 NOTE — Progress Notes (Signed)
Subjective:    Patient ID: [name], female    DOB: 08/24/1971, 46 y.o.   MRN: [id number]  Patient presents for 6 month F/U (is not fasting) Sleep apnea?Concern for possible sleep apnea she states for many months her husband is complaining that she snores very loudly and at times she will do a snorting noise that she can't catch her breath. She states that sleep apnea does run in her family. She is caught herself on one occasion waking up gasping for her breath  Ankle swelling-is noted to the past few months she gets some mild ankle swelling she props them up it does go down. She's had indigestion but no sharp chest pain or shortness of breath associated but has been worried about this and came in to have it evaluated.  Hypothyroidism-is concerned about the change in the color of her pill if she is getting it from a different manufacturer but she has now been taking a little more consistently  Acid reflux/ chest discomfort- she has been taking Nexium  States that she has been trying to About her carbs as well as her soda intake she has not been exercising weight is the same as it was 6 months ago  Review Of Systems:  GEN- denies fatigue, fever, weight loss,weakness, recent illness HEENT- denies eye drainage, change in vision, nasal discharge, CVS- denies chest pain, palpitations RESP- denies SOB, cough, wheeze ABD- denies N/V, change in stools, abd pain GU- denies dysuria, hematuria, dribbling, incontinence MSK- denies joint pain, muscle aches, injury Neuro- denies headache, dizziness, syncope, seizure activity       Objective:    BP 112/62   Pulse 100   Temp 98.6 F (37 C) (Oral)   Resp 14   Ht 5\' 2"  (1.575 m)   Wt 221 lb (100.2 kg)   SpO2 98%   BMI 40.42 kg/m  GEN- NAD, alert and oriented x3,obese  HEENT- PERRL, EOMI, non injected sclera, pink conjunctiva, MMM, oropharynx clear Neck- Supple, no thyromegaly, no JVD CVS- RRR, no  murmur RESP-CTAB ABD-NABS,soft,NT,ND EXT- trace pedal edema Pulses- Radial, DP- 2+  EKG- NSR, no ST changes      Assessment & Plan:      Problem List Items Addressed This Visit      Unprioritized   Sleep apnea, unspecified    Concern for OSA, send for sleep study      Relevant Orders   Nocturnal polysomnography (NPSG)   Morbid obesity ([place])    This keeping a journal for her attrition and exercising 2 days a week to start with. She should also weigh regularly and also measure her inches.      Hypothyroidism    Continue synthroid      Relevant Orders   TSH   T3, free   T4, free   GERD (gastroesophageal reflux disease)    Can use zantac at bedtime, nexium if severe She was concerned about long term effects of the PPI Discussed foods to avoid       Other Visit Diagnoses    Atypical chest pain    -  Primary   EKG is normal uptake her chest discomfort is from the indigestion   Relevant Orders   Basic metabolic panel   EKG 36-RWER (Completed)   Peripheral edema       Relevant Orders   Basic metabolic panel      Note: This dictation was prepared with Dragon dictation along with smaller phrase technology. Any transcriptional  errors that result from this process are unintentional.

## 2017-06-06 NOTE — Assessment & Plan Note (Signed)
Continue synthroid.

## 2017-06-06 NOTE — Patient Instructions (Addendum)
Referral for sleep study  Take the thyroid medication daily every morning Work on weight changes Try the HCTZ as needed F/U 4 months

## 2017-06-06 NOTE — Assessment & Plan Note (Signed)
Concern for OSA, send for sleep study

## 2017-06-06 NOTE — Assessment & Plan Note (Signed)
This keeping a journal for her attrition and exercising 2 days a week to start with. She should also weigh regularly and also measure her inches.

## 2017-06-07 LAB — BASIC METABOLIC PANEL
BUN: 13 mg/dL (ref 7–25)
CHLORIDE: 103 mmol/L (ref 98–110)
CO2: 24 mmol/L (ref 20–32)
CREATININE: 0.84 mg/dL (ref 0.50–1.10)
Calcium: 9.5 mg/dL (ref 8.6–10.2)
Glucose, Bld: 76 mg/dL (ref 65–99)
POTASSIUM: 4 mmol/L (ref 3.5–5.3)
SODIUM: 138 mmol/L (ref 135–146)

## 2017-06-07 LAB — TSH: TSH: 1.37 mIU/L

## 2017-06-07 LAB — T3, FREE: T3 FREE: 3.2 pg/mL (ref 2.3–4.2)

## 2017-06-07 LAB — T4, FREE: Free T4: 1.2 ng/dL (ref 0.8–1.8)

## 2017-06-08 ENCOUNTER — Encounter: Payer: Self-pay | Admitting: Family Medicine

## 2017-06-11 ENCOUNTER — Ambulatory Visit: Payer: 59 | Admitting: Family Medicine

## 2017-07-20 ENCOUNTER — Ambulatory Visit (HOSPITAL_BASED_OUTPATIENT_CLINIC_OR_DEPARTMENT_OTHER): Payer: 59 | Attending: Family Medicine | Admitting: Internal Medicine

## 2017-07-20 VITALS — Ht 61.0 in | Wt 221.0 lb

## 2017-07-20 DIAGNOSIS — G4730 Sleep apnea, unspecified: Secondary | ICD-10-CM | POA: Diagnosis present

## 2017-07-20 DIAGNOSIS — G4733 Obstructive sleep apnea (adult) (pediatric): Secondary | ICD-10-CM | POA: Diagnosis not present

## 2017-07-29 DIAGNOSIS — G4730 Sleep apnea, unspecified: Secondary | ICD-10-CM

## 2017-07-29 NOTE — Procedures (Signed)
   Patient Name: [name], [name] Date: 07/20/2017 Gender: Female D.O.B: 15-Sep-1970 Age (years): 45 Referring Provider: Alycia Rossetti Height (inches): 61 Interpreting Physician: Baird Lyons MD, ABSM Weight (lbs): 221 RPSGT: Baxter Flattery BMI: 42 MRN: [id number] Neck Size: 15.00 CLINICAL INFORMATION Sleep Study Type: NPSG  Indication for sleep study: Fatigue, Obesity, Snoring, Witnesses Apnea / Gasping During Sleep  Epworth Sleepiness Score: 3  SLEEP STUDY TECHNIQUE As per the AASM Manual for the Scoring of Sleep and Associated Events v2.3 (April 2016) with a hypopnea requiring 4% desaturations.  The channels recorded and monitored were frontal, central and occipital EEG, electrooculogram (EOG), submentalis EMG (chin), nasal and oral airflow, thoracic and abdominal wall motion, anterior tibialis EMG, snore microphone, electrocardiogram, and pulse oximetry.  MEDICATIONS Medications self-administered by patient taken the night of the study : none reported  SLEEP ARCHITECTURE The study was initiated at 10:31:35 PM and ended at 4:38:39 AM.  Sleep onset time was 45.3 minutes and the sleep efficiency was 65.7%. The total sleep time was 241.0 minutes.  Stage REM latency was 85.0 minutes.  The patient spent 8.51% of the night in stage N1 sleep, 79.46% in stage N2 sleep, 0.00% in stage N3 and 12.03% in REM.  Alpha intrusion was absent.  Supine sleep was 48.76%.  RESPIRATORY PARAMETERS The overall apnea/hypopnea index (AHI) was 0.5 per hour. There were 0 total apneas, including 0 obstructive, 0 central and 0 mixed apneas. There were 2 hypopneas and 3 RERAs.  The AHI during Stage REM sleep was 2.1 per hour.  AHI while supine was 1.0 per hour.  The mean oxygen saturation was 96.98%. The minimum SpO2 during sleep was 94.00%.  soft snoring was noted during this study.  CARDIAC DATA The 2 lead EKG demonstrated sinus rhythm. The mean heart rate was 81.50 beats per minute.  Other EKG findings include: None.  LEG MOVEMENT DATA The total PLMS were 0 with a resulting PLMS index of 0.00. Associated arousal with leg movement index was 0.0 .  IMPRESSIONS - No significant obstructive sleep apnea occurred during this study (AHI = 0.5/h). - No significant central sleep apnea occurred during this study (CAI = 0.0/h). - The patient had minimal or no oxygen desaturation during the study (Min O2 = 94.00%) - The patient snored with soft snoring volume. - No cardiac abnormalities were noted during this study. - Clinically significant periodic limb movements did not occur during sleep. No significant associated arousals. - Patient had some difficulty initiating and maintaining sleep.  DIAGNOSIS - Normal study  RECOMMENDATIONS  - Sleep hygiene should be reviewed to assess factors that may improve sleep quality. - Weight management and regular exercise should be initiated or continued if appropriate.  [Electronically signed] 07/29/2017 02:36 PM  Baird Lyons MD, [place], American Board of Sleep Medicine   NPI: [id number]                          [place], [facility]  ELECTRONICALLY SIGNED ON:  07/29/2017, 2:34 PM [place] PH: [phone]   FX: [phone] [place]

## 2017-08-07 ENCOUNTER — Encounter: Payer: Self-pay | Admitting: *Deleted

## 2017-09-10 ENCOUNTER — Encounter: Payer: Self-pay | Admitting: Family Medicine

## 2017-09-18 DIAGNOSIS — H527 Unspecified disorder of refraction: Secondary | ICD-10-CM | POA: Diagnosis not present

## 2017-09-18 DIAGNOSIS — D23121 Other benign neoplasm of skin of left upper eyelid, including canthus: Secondary | ICD-10-CM | POA: Diagnosis not present

## 2017-09-18 DIAGNOSIS — H16223 Keratoconjunctivitis sicca, not specified as Sjogren's, bilateral: Secondary | ICD-10-CM | POA: Diagnosis not present

## 2017-10-05 ENCOUNTER — Ambulatory Visit (INDEPENDENT_AMBULATORY_CARE_PROVIDER_SITE_OTHER): Payer: 59 | Admitting: Family Medicine

## 2017-10-05 ENCOUNTER — Encounter: Payer: Self-pay | Admitting: Family Medicine

## 2017-10-05 ENCOUNTER — Other Ambulatory Visit: Payer: Self-pay

## 2017-10-05 DIAGNOSIS — K219 Gastro-esophageal reflux disease without esophagitis: Secondary | ICD-10-CM

## 2017-10-05 DIAGNOSIS — E039 Hypothyroidism, unspecified: Secondary | ICD-10-CM | POA: Diagnosis not present

## 2017-10-05 MED ORDER — LIRAGLUTIDE -WEIGHT MANAGEMENT 18 MG/3ML ~~LOC~~ SOPN: 0.6000 mg | PEN_INJECTOR | Freq: Every day | SUBCUTANEOUS | 2 refills | Status: DC

## 2017-10-05 NOTE — Patient Instructions (Signed)
Try Zantac 150mg   Start saxenda:  Week 1: 0.6mg  injected daily:   Week 2:  1.2 mg injected daily  Week 3:  1.8mg  injected daily  Week 4:  2.4mg  injected daily  Week 5:  3mg  injected daily F/U 8 weeks for weight

## 2017-10-05 NOTE — Assessment & Plan Note (Signed)
Can try zantac, monitor foods, decrease the sparking water, the carbonation causes some of her symptoms

## 2017-10-05 NOTE — Assessment & Plan Note (Signed)
TFT at goal , no change to medication

## 2017-10-05 NOTE — Assessment & Plan Note (Signed)
Trial of Saxenda, discussed the medication and side effects F/U 8 weeks for recheck on weight and tolerance Expect 5-10lb loss Discussed portion control eating 2-3 meals a day consistently

## 2017-10-05 NOTE — Progress Notes (Signed)
   Subjective:    Patient ID: [name], female    DOB: 04/20/1971, 47 y.o.   MRN: [id number]  Patient presents for Follow-up (is not fasting)   Obesity- working with a trainer, weight in the morning 217, lost 10lbs with meal planning , stopped after she had back pain, now back to 3 days a week with no pain this week  frustated with her weight and inability to lose weight She feels she actually doesn't eat enough most days She does have some reflux takes pepcid PRN    Review Of Systems:  GEN- denies fatigue, fever, weight loss,weakness, recent illness HEENT- denies eye drainage, change in vision, nasal discharge, CVS- denies chest pain, palpitations RESP- denies SOB, cough, wheeze ABD- denies N/V, change in stools, abd pain GU- denies dysuria, hematuria, dribbling, incontinence MSK- denies joint pain, muscle aches, injury Neuro- denies headache, dizziness, syncope, seizure activity       Objective:    BP 112/62   Pulse 82   Temp 98.7 F (37.1 C) (Oral)   Resp 14   Ht 5\' 2"  (1.575 m)   Wt 223 lb (101.2 kg)   SpO2 100%   BMI 40.79 kg/m  GEN- NAD, alert and oriented x3 HEENT- PERRL, EOMI, non injected sclera, pink conjunctiva, MMM, oropharynx clear Neck- Supple, no thyromegaly CVS- RRR, no murmur RESP-CTAB ABD-NABS,soft,NT,ND EXT- No edema Pulses- Radial  2+        Assessment & Plan:      Problem List Items Addressed This Visit      Unprioritized   Morbid obesity ([place]) - Primary    Trial of Saxenda, discussed the medication and side effects F/U 8 weeks for recheck on weight and tolerance Expect 5-10lb loss Discussed portion control eating 2-3 meals a day consistently      Relevant Medications   Liraglutide -Weight Management (SAXENDA) 18 MG/3ML SOPN   Hypothyroidism    TFT at goal , no change to medication      GERD (gastroesophageal reflux disease)    Can try zantac, monitor foods, decrease the sparking water, the carbonation causes some of  her symptoms          Note: This dictation was prepared with Dragon dictation along with smaller phrase technology. Any transcriptional errors that result from this process are unintentional.

## 2017-10-11 ENCOUNTER — Telehealth: Payer: Self-pay | Admitting: *Deleted

## 2017-10-11 NOTE — Telephone Encounter (Signed)
Your PA has been faxed to the plan as a paper copy. Please contact the plan directly if you haven't received a determination in a typical timeframe.  You will be notified of the determination electronically and via fax. 

## 2017-10-11 NOTE — Telephone Encounter (Signed)
Received request from pharmacy for [place] on Saxenda.   PA submitted.   Dx: E66.09- obesity.   10/05/2017: Weight- 223lb Height- 62" BMI- 40.78

## 2017-10-16 NOTE — Telephone Encounter (Signed)
Received PA determination.   PA denied.   Patient must try and fail formulary alternatives: Belviq Xenical  Patient cannot use Xenical D/T concurrent treatment of hypothyroidism.   MD to be made aware.

## 2017-10-16 NOTE — Telephone Encounter (Signed)
She has to try the belviq, this is a pill not a stimulant but helps with overeating  She can try it 10mg  BID. While I do think the [place] works better, her insurance will not cover unless she tries this first.

## 2017-10-19 NOTE — Telephone Encounter (Signed)
Call placed to patient and patient made aware.   Will let us know if she wants to try Belviq.

## 2017-10-25 ENCOUNTER — Encounter: Payer: Self-pay | Admitting: *Deleted

## 2017-10-25 NOTE — Telephone Encounter (Signed)
Call placed to patient and patient made aware.   Patient states that she cannot use Belviq D/T SE of HA. Currently has dx of migraine.   Appeal faxed.

## 2017-10-31 MED ORDER — INSULIN PEN NEEDLE 32G X 6 MM MISC: 1 refills | Status: DC

## 2017-10-31 MED ORDER — LIRAGLUTIDE -WEIGHT MANAGEMENT 18 MG/3ML ~~LOC~~ SOPN: 0.6000 mg | PEN_INJECTOR | Freq: Every day | SUBCUTANEOUS | 2 refills | Status: DC

## 2017-10-31 MED FILL — UNIFINE PENTIPS 31GX3/16: 31G X 5 MM | 90 days supply | Qty: 100 | Fill #0

## 2017-10-31 MED FILL — SAXENDA 18 MG/3 ML PEN: 18 | 30 days supply | Qty: 15 | Fill #0

## 2017-10-31 MED FILL — UNIFINE PENTIPS 31GX3/16": 31G X 5 MM | 90 days supply | Qty: 100 | Fill #0

## 2017-10-31 NOTE — Telephone Encounter (Signed)
Received Appeal Determination.   Appeal case #[id number] approved 10/26/2017- 02/22/2018.  Call placed to patient and patient made aware.   Pharmacy made aware.

## 2017-12-03 ENCOUNTER — Ambulatory Visit: Payer: 59 | Admitting: Family Medicine

## 2018-03-07 ENCOUNTER — Other Ambulatory Visit: Payer: Self-pay

## 2018-03-07 ENCOUNTER — Ambulatory Visit (INDEPENDENT_AMBULATORY_CARE_PROVIDER_SITE_OTHER): Payer: 59 | Admitting: Family Medicine

## 2018-03-07 ENCOUNTER — Encounter: Payer: Self-pay | Admitting: Family Medicine

## 2018-03-07 ENCOUNTER — Other Ambulatory Visit: Payer: Self-pay | Admitting: Family Medicine

## 2018-03-07 VITALS — BP 110/68 | HR 66 | Temp 98.1°F | Resp 14 | Ht 62.0 in | Wt 223.0 lb

## 2018-03-07 DIAGNOSIS — N888 Other specified noninflammatory disorders of cervix uteri: Secondary | ICD-10-CM

## 2018-03-07 DIAGNOSIS — E039 Hypothyroidism, unspecified: Secondary | ICD-10-CM

## 2018-03-07 DIAGNOSIS — R609 Edema, unspecified: Secondary | ICD-10-CM | POA: Diagnosis not present

## 2018-03-07 DIAGNOSIS — Z0000 Encounter for general adult medical examination without abnormal findings: Secondary | ICD-10-CM

## 2018-03-07 DIAGNOSIS — K219 Gastro-esophageal reflux disease without esophagitis: Secondary | ICD-10-CM

## 2018-03-07 DIAGNOSIS — Z8049 Family history of malignant neoplasm of other genital organs: Secondary | ICD-10-CM | POA: Diagnosis not present

## 2018-03-07 LAB — LIPID PANEL
CHOL/HDL RATIO: 2.1 (calc) (ref <5.0)
CHOLESTEROL: 156 mg/dL (ref <200)
HDL: 73 mg/dL (ref >50)
LDL Cholesterol (Calc): 70 mg/dL (calc)
Non-HDL Cholesterol (Calc): 83 mg/dL (calc) (ref <130)
Triglycerides: 58 mg/dL (ref <150)

## 2018-03-07 LAB — CBC WITH DIFFERENTIAL/PLATELET
BASOS PCT: 0.8 %
Basophils Absolute: 51 cells/uL (ref 0–200)
EOS PCT: 2.2 %
Eosinophils Absolute: 141 cells/uL (ref 15–500)
HCT: 37.4 % (ref 35.0–45.0)
Hemoglobin: 12.1 g/dL (ref 11.7–15.5)
Lymphs Abs: 2349 cells/uL (ref 850–3900)
MCH: 27.5 pg (ref 27.0–33.0)
MCHC: 32.4 g/dL (ref 32.0–36.0)
MCV: 85 fL (ref 80.0–100.0)
MONOS PCT: 10.4 %
MPV: 10.1 fL (ref 7.5–12.5)
Neutro Abs: 3194 cells/uL (ref 1500–7800)
Neutrophils Relative %: 49.9 %
Platelets: 363 10*3/uL (ref 140–400)
RBC: 4.4 10*6/uL (ref 3.80–5.10)
RDW: 14.4 % (ref 11.0–15.0)
TOTAL LYMPHOCYTE: 36.7 %
WBC mixed population: 666 cells/uL (ref 200–950)
WBC: 6.4 10*3/uL (ref 3.8–10.8)

## 2018-03-07 LAB — TSH: TSH: 1.1 mIU/L

## 2018-03-07 LAB — COMPLETE METABOLIC PANEL WITH GFR
AG RATIO: 1.3 (calc) (ref 1.0–2.5)
ALKALINE PHOSPHATASE (APISO): 68 U/L (ref 33–115)
ALT: 16 U/L (ref 6–29)
AST: 16 U/L (ref 10–35)
Albumin: 4 g/dL (ref 3.6–5.1)
BUN: 13 mg/dL (ref 7–25)
CO2: 25 mmol/L (ref 20–32)
Calcium: 9.3 mg/dL (ref 8.6–10.2)
Chloride: 104 mmol/L (ref 98–110)
Creat: 0.96 mg/dL (ref 0.50–1.10)
GFR, Est African American: 82 mL/min/{1.73_m2} (ref >=60)
GFR, Est Non African American: 71 mL/min/{1.73_m2} (ref >=60)
Globulin: 3 g/dL (calc) (ref 1.9–3.7)
Glucose, Bld: 83 mg/dL (ref 65–99)
POTASSIUM: 4.4 mmol/L (ref 3.5–5.3)
Sodium: 136 mmol/L (ref 135–146)
Total Bilirubin: 0.4 mg/dL (ref 0.2–1.2)
Total Protein: 7 g/dL (ref 6.1–8.1)

## 2018-03-07 LAB — WET PREP FOR TRICH, YEAST, CLUE

## 2018-03-07 MED ORDER — FLUTICASONE PROPIONATE 50 MCG/ACT NA SUSP: 2.0000 | Freq: Every day | NASAL | 6 refills | Status: DC

## 2018-03-07 MED ORDER — CETIRIZINE HCL 10 MG PO TABS: 10.0000 mg | ORAL_TABLET | Freq: Every day | ORAL | 2 refills | Status: DC

## 2018-03-07 MED FILL — LEVOTHYROXINE 25 MCG TABLET: 25 | 90 days supply | Qty: 90 | Fill #0

## 2018-03-07 MED FILL — FLUTICASONE PROP 50 MCG SPR: 50 | 30 days supply | Qty: 16 | Fill #0

## 2018-03-07 NOTE — Progress Notes (Signed)
Patient: [name], Female    DOB: 1971-04-03, 47 y.o.   MRN: [id number] Visit Date: 03/07/2018  Today's Provider: Delsa Grana, PA-C   Chief Complaint  Patient presents with  . CPE with PAP    is fasting- high family hx of cervical cancer- would like pap q year   Subjective:    Annual physical exam [name] is a 47 y.o. female who presents today for health maintenance and complete physical. She feels well. She reports exercising infrequently. She reports she is sleeping well.  ----------------------------------------------------------------- Pt has hx of allergies, wants refills.  Has hx of peripheral edema, uses HCTZ prn, no new sx or worsening  Pt is obese, BMI currently 40+, was going to take [place] starting in March, but she did not because her stomach hurts.  GERD has worsened despite decreasing caffeine and coffee.  She take pepcid or zantac at night and was taking nexium, but she tries to use it for two weeks at a time only and no more.  In the past two weeks she's only taken one dose of acid meds.  She has some abdominal bloating and gas.    She has reported family hx of cervical CA, requests annual PAP today.  Last pap 12/04/16, reviewed, negative  Review of Systems  Constitutional: Negative.   HENT: Negative.   Eyes: Negative.   Respiratory: Negative.   Cardiovascular: Negative.   Gastrointestinal: Negative.   Endocrine: Negative.   Genitourinary: Negative.   Musculoskeletal: Negative.   Skin: Negative.   Allergic/Immunologic: Negative.   Neurological: Negative.   Hematological: Negative.   Psychiatric/Behavioral: Negative.   All other systems reviewed and are negative.   Social History      She  reports that she has never smoked. She has never used smokeless tobacco. She reports that she drinks alcohol. She reports that she does not use drugs.       Social History   Socioeconomic History  . Marital status: Single    Spouse name: Not on  file  . Number of children: Not on file  . Years of education: Not on file  . Highest education level: Not on file  Occupational History  . Not on file  Social Needs  . Financial resource strain: Not on file  . Food insecurity:    Worry: Not on file    Inability: Not on file  . Transportation needs:    Medical: Not on file    Non-medical: Not on file  Tobacco Use  . Smoking status: Never Smoker  . Smokeless tobacco: Never Used  Substance and Sexual Activity  . Alcohol use: Yes    Comment: occ  . Drug use: No  . Sexual activity: Yes  Lifestyle  . Physical activity:    Days per week: Not on file    Minutes per session: Not on file  . Stress: Not on file  Relationships  . Social connections:    Talks on phone: Not on file    Gets together: Not on file    Attends religious service: Not on file    Active member of club or organization: Not on file    Attends meetings of clubs or organizations: Not on file    Relationship status: Not on file  Other Topics Concern  . Not on file  Social History Narrative  . Not on file    Past Medical History:  Diagnosis Date  . Anxiety   . Panic attack   .  Thyroid disease   . Tinnitus      Patient Active Problem List   Diagnosis Date Noted  . Hypothyroidism 12/04/2016  . Morbid obesity ([place]) 12/04/2016  . GERD (gastroesophageal reflux disease) 12/04/2016  . Chronic allergic rhinitis 12/04/2016  . Tinnitus 12/04/2016    History reviewed. No pertinent surgical history.  Family History        Family Status  Relation Name Status  . Mother  Alive  . Father  Alive  . Brother  Alive  . Mat Aunt  (Not Specified)  . Ethlyn Daniels  (Not Specified)  . MGM  Deceased  . MGF  Deceased  . PGM  Alive  . PGF  Deceased        Her family history includes Arthritis in her maternal grandmother and mother; Breast cancer in her maternal aunt and paternal aunt; Cancer in her maternal aunt and paternal aunt; Diabetes in her paternal  grandmother; Heart disease in her paternal grandfather; Hyperlipidemia in her father; Hypertension in her father and mother; Stroke in her maternal grandfather.      No Known Allergies   Current Outpatient Medications:  .  fluticasone (FLONASE) 50 MCG/ACT nasal spray, Place 2 sprays into both nostrils daily., Disp: 48 g, Rfl: 3 .  hydrochlorothiazide (HYDRODIURIL) 12.5 MG tablet, Take 1 tablet (12.5 mg total) by mouth daily as needed., Disp: 30 tablet, Rfl: 1 .  Insulin Pen Needle (BD PEN NEEDLE MICRO U/F) 32G X 6 MM MISC, Use as directed to inject Saxenda QD., Disp: 100 each, Rfl: 1 .  levothyroxine (SYNTHROID, LEVOTHROID) 25 MCG tablet, Take 1 tablet (25 mcg total) by mouth daily., Disp: 90 tablet, Rfl: 1   Patient Care Team: Alycia Rossetti, MD as PCP - General (Family Medicine)      Objective:   Vitals: BP 110/68   Pulse 66   Temp 98.1 F (36.7 C) (Oral)   Resp 14   Ht 5\' 2"  (1.575 m)   Wt 223 lb (101.2 kg)   LMP 02/21/2018 Comment: regular  SpO2 98%   BMI 40.79 kg/m    Vitals:   03/07/18 0835  BP: 110/68  Pulse: 66  Resp: 14  Temp: 98.1 F (36.7 C)  TempSrc: Oral  SpO2: 98%  Weight: 223 lb (101.2 kg)  Height: 5\' 2"  (1.575 m)     Physical Exam  Constitutional: She is oriented to person, place, and time. She appears well-developed and well-nourished.  Non-toxic appearance. No distress.  HENT:  Head: Normocephalic and atraumatic.  Right Ear: External ear normal.  Left Ear: External ear normal.  Nose: Nose normal.  Mouth/Throat: Uvula is midline, oropharynx is clear and moist and mucous membranes are normal.  Eyes: Pupils are equal, round, and reactive to light. Conjunctivae, EOM and lids are normal.  Neck: Normal range of motion and phonation normal. Neck supple. No tracheal deviation present.  Cardiovascular: Normal rate, regular rhythm, normal heart sounds and normal pulses. Exam reveals no gallop and no friction rub.  No murmur heard. Pulses:      Radial  pulses are 2+ on the right side, and 2+ on the left side.       Posterior tibial pulses are 2+ on the right side, and 2+ on the left side.  Pulmonary/Chest: Effort normal and breath sounds normal. No stridor. No respiratory distress. She has no wheezes. She has no rhonchi. She has no rales. She exhibits no tenderness.  Abdominal: Soft. Normal appearance and bowel sounds are normal.  She exhibits no distension. There is no tenderness. There is no rebound and no guarding.  Genitourinary: Vagina normal and uterus normal. There is no rash or tenderness on the right labia. There is no rash or tenderness on the left labia. Cervix exhibits friability. Cervix exhibits no motion tenderness and no discharge. Right adnexum displays no mass, no tenderness and no fullness. Left adnexum displays no mass, no tenderness and no fullness. No erythema or tenderness in the vagina. No signs of injury around the vagina. No vaginal discharge found.  Musculoskeletal: Normal range of motion. She exhibits no edema or deformity.  Lymphadenopathy:    She has no cervical adenopathy.  Neurological: She is alert and oriented to person, place, and time. She exhibits normal muscle tone. Gait normal.  Skin: Skin is warm, dry and intact. Capillary refill takes less than 2 seconds. No rash noted. She is not diaphoretic. No pallor.  Psychiatric: She has a normal mood and affect. Her speech is normal and behavior is normal.  Nursing note and vitals reviewed.    Depression Screen PHQ 2/9 Scores 03/07/2018 10/05/2017 06/06/2017 12/04/2016  PHQ - 2 Score 0 0 0 0  PHQ- 9 Score 2 1 2  -      Assessment & Plan:     Routine Health Maintenance and Physical Exam  Exercise Activities and Dietary recommendations Goals    None    increase exercise as tolerated  Immunization History  Administered Date(s) Administered  . Influenza-Unspecified 05/12/2016  . Tdap 12/04/2016    Health Maintenance  Topic Date Due  . INFLUENZA VACCINE   04/11/2018  . PAP SMEAR  12/05/2019  . TETANUS/TDAP  12/05/2026  . HIV Screening  Completed   Mammogram due 05/25/2019  Discussed health benefits of physical activity, and encouraged her to engage in regular exercise appropriate for her age and condition.    --------------------------------------------------------------------   ICD-10-CM   1. Routine general medical examination at a health care facility M09.47 COMPLETE METABOLIC PANEL WITH GFR    CBC with Differential/Platelet    Lipid panel    Urinalysis, Routine w reflex microscopic    TSH    PAP, Thin Prep w/HPV rflx HPV Type 16/18  2. Hypothyroidism, unspecified type E03.9 TSH  3. Morbid obesity ([place]) E66.01   4. Friable cervix N88.8 C. trachomatis/N. gonorrhoeae RNA    WET PREP FOR [place], YEAST, CLUE    PAP,TP IMGw/HPV RNA,rflx [id number],94/76  5. Gastroesophageal reflux disease without esophagitis K21.9 H. pylori breath test  6. Peripheral edema R60.9   7. Family history of cervical cancer Z80.49 PAP, Thin Prep w/HPV rflx HPV Type 16/18    PAP,TP IMGw/HPV RNA,rflx [id number],56/81      Delsa Grana, PA-C 03/07/18 9:51 AM  [facility]

## 2018-03-07 NOTE — Patient Instructions (Signed)
Food Choices for Gastroesophageal Reflux Disease, Adult When you have gastroesophageal reflux disease (GERD), the foods you eat and your eating habits are very important. Choosing the right foods can help ease your discomfort. What guidelines do I need to follow?  Choose fruits, vegetables, whole grains, and low-fat dairy products.  Choose low-fat meat, fish, and poultry.  Limit fats such as oils, salad dressings, butter, nuts, and avocado.  Keep a food diary. This helps you identify foods that cause symptoms.  Avoid foods that cause symptoms. These may be different for everyone.  Eat small meals often instead of 3 large meals a day.  Eat your meals slowly, in a place where you are relaxed.  Limit fried foods.  Cook foods using methods other than frying.  Avoid drinking alcohol.  Avoid drinking large amounts of liquids with your meals.  Avoid bending over or lying down until 2-3 hours after eating. What foods are not recommended? These are some foods and drinks that may make your symptoms worse: Vegetables  Tomatoes. Tomato juice. Tomato and spaghetti sauce. Chili peppers. Onion and garlic. Horseradish. Fruits  Oranges, grapefruit, and lemon (fruit and juice). Meats  High-fat meats, fish, and poultry. This includes hot dogs, ribs, ham, sausage, salami, and bacon. Dairy  Whole milk and chocolate milk. Sour cream. Cream. Butter. Ice cream. Cream cheese. Drinks  Coffee and tea. Bubbly (carbonated) drinks or energy drinks. Condiments  Hot sauce. Barbecue sauce. Sweets/Desserts  Chocolate and cocoa. Donuts. Peppermint and spearmint. Fats and Oils  High-fat foods. This includes French fries and potato chips. Other  Vinegar. Strong spices. This includes black pepper, white pepper, red pepper, cayenne, curry powder, cloves, ginger, and chili powder. The items listed above may not be a complete list of foods and drinks to avoid. Contact your dietitian for more information.    This information is not intended to replace advice given to you by your health care provider. Make sure you discuss any questions you have with your health care provider. Document Released: 02/27/2012 Document Revised: 02/03/2016 Document Reviewed: 07/02/2013 Elsevier Interactive Patient Education  2017 Elsevier Inc.  

## 2018-03-08 LAB — C. TRACHOMATIS/N. GONORRHOEAE RNA
C. trachomatis RNA, TMA: NOT DETECTED
N. gonorrhoeae RNA, TMA: NOT DETECTED

## 2018-03-08 LAB — PAP, TP IMAGING W/ HPV RNA, RFLX HPV TYPE 16,18/45: HPV DNA High Risk: NOT DETECTED

## 2018-03-11 LAB — H. PYLORI BREATH TEST: H. pylori Breath Test: NOT DETECTED

## 2018-03-13 MED FILL — IBUPROFEN 800 MG TAB: 800 | 4 days supply | Qty: 12 | Fill #0

## 2018-03-13 MED FILL — AMOXICILLIN 500 MG CAPSULE: 500 | 5 days supply | Qty: 20 | Fill #0

## 2018-03-13 NOTE — Progress Notes (Signed)
H. Pylori test is negative.  If your symptoms do not improve we can retest this in the future but you will have to hold all Maalox, Mylanta, antacids for 2 weeks.   Continue to adjust foods for acid reflux.  Use proton pump inhibitors in the morning on empty stomach to help prevent pain and indigestion.  If you have concerns with recurrent use of these medications we can refer you to GI for specialized work-up however it is very common to use these medications for more than 2 weeks, it just needs to be monitored by her PCP.  All pelvic and cervical tests were negative including Pap smear, HPV, STDs There was no blood detected in her stool, is likely from rectal irritation or hemorrhoids Cell counts, electrolytes, kidney function and liver function were all excellent Thyroid and cholesterol were normal  I would follow-up with your PCP

## 2018-05-08 ENCOUNTER — Other Ambulatory Visit: Payer: Self-pay | Admitting: Family Medicine

## 2018-05-08 DIAGNOSIS — Z1231 Encounter for screening mammogram for malignant neoplasm of breast: Secondary | ICD-10-CM

## 2018-06-04 ENCOUNTER — Ambulatory Visit: Payer: 59

## 2018-06-04 MED FILL — FLUTICASONE PROP 50 MCG SPR: 50 | 30 days supply | Qty: 16 | Fill #1

## 2018-07-17 ENCOUNTER — Ambulatory Visit (INDEPENDENT_AMBULATORY_CARE_PROVIDER_SITE_OTHER): Payer: 59 | Admitting: Family Medicine

## 2018-07-17 ENCOUNTER — Encounter: Payer: Self-pay | Admitting: Family Medicine

## 2018-07-17 ENCOUNTER — Other Ambulatory Visit: Payer: Self-pay

## 2018-07-17 VITALS — BP 112/62 | HR 86 | Temp 98.1°F | Resp 14 | Ht 62.0 in | Wt 224.0 lb

## 2018-07-17 DIAGNOSIS — F411 Generalized anxiety disorder: Secondary | ICD-10-CM | POA: Diagnosis not present

## 2018-07-17 DIAGNOSIS — R609 Edema, unspecified: Secondary | ICD-10-CM | POA: Diagnosis not present

## 2018-07-17 MED ORDER — POTASSIUM CHLORIDE CRYS ER 20 MEQ PO TBCR
20.0000 meq | EXTENDED_RELEASE_TABLET | Freq: Every day | ORAL | 0 refills | Status: DC | PRN
Start: 2018-07-17 — End: 2018-07-17

## 2018-07-17 MED ORDER — SERTRALINE HCL 25 MG PO TABS: ORAL_TABLET | ORAL | 0 refills | Status: DC

## 2018-07-17 MED ORDER — FUROSEMIDE 20 MG PO TABS: 20.0000 mg | ORAL_TABLET | Freq: Every day | ORAL | 0 refills | Status: DC | PRN

## 2018-07-17 MED ORDER — POTASSIUM CHLORIDE CRYS ER 20 MEQ PO TBCR: 20.0000 meq | EXTENDED_RELEASE_TABLET | Freq: Every day | ORAL | 3 refills | Status: DC

## 2018-07-17 MED ORDER — SERTRALINE HCL 50 MG PO TABS: 50.0000 mg | ORAL_TABLET | Freq: Every day | ORAL | 3 refills | Status: DC

## 2018-07-17 MED ORDER — FUROSEMIDE 20 MG PO TABS: 10.0000 mg | ORAL_TABLET | Freq: Every day | ORAL | 0 refills | Status: DC | PRN

## 2018-07-17 MED FILL — SERTRALINE HCL 25 MG TABLET: 25 | 44 days supply | Qty: 74 | Fill #0

## 2018-07-17 NOTE — Progress Notes (Signed)
Patient ID: [name], female    DOB: [birth year], 47 y.o.   MRN: [id number]  PCP: Alycia Rossetti, MD  Chief Complaint  Patient presents with  . Edema    L ankle- has not taken HCTZ  . Anxiety    would like to resume Zoloft for anxiety- has started new job  . Shoulder Pain    intermittent pain in R shoulder    Subjective:   [name] is a 47 y.o. female, presents to clinic with CC of continued LE swelling, she has not done any of the previous recommendations including using the diuretic available to her.  IT has not worsened.  She did not take the hydrochlorthiazide prescribed to her from her PCP because she believes is now expired.  So the swelling around her feet and ankles, she has not had progressive to her shins.  She does not appreciate any weight gain, denies any shortness of breath, orthopnea, PND.  She is trying to get up and walk more at work.  Complains of anxiety, was previously on Zoloft, she does complain of excessive worry, trouble relaxing, irritability, daily feeling anxious, nervous and on edge GAD-7: 16 Patient states that she has been dealing with a lot of stress with a new job, tensions in her family, problems with her husband's job, she is feeling anxious, having difficulty focusing and feeling like she is having a difficult time balancing everything, she has been contemplating starting medications again for about 8 months and she feels like its time and she needs to start again.  She is previously on Zoloft 100 mg.  She also has gone to available counseling and resources from her employer but after going for about 3 months they said that she had a "green light" to stop going.  Patient states that she is very introverted she does not talk to anyone about things that are bothering her, and if one in her family comes to her and she is a mediator of most of their problems.  She also states she is very sensitive and very empathetic, this also makes stress and  anxiety worse for her.   She did mention to the nurse some right shoulder pain however in exam room she points to upper right chest and clavicle area stating that she has intermittent pain that comes there and sometimes in her posterior shoulder muscles it comes and goes it is not currently the right now.  Believes is related also to her stress similar to how she sometimes will get chest pain or indigestion and she believes this is worse when she is more stressed.   Patient Active Problem List   Diagnosis Date Noted  . Hypothyroidism 12/04/2016  . Morbid obesity ([place]) 12/04/2016  . GERD (gastroesophageal reflux disease) 12/04/2016  . Chronic allergic rhinitis 12/04/2016  . Tinnitus 12/04/2016     Prior to Admission medications   Medication Sig Start Date End Date Taking? Authorizing Provider  cetirizine (ZYRTEC ALLERGY) 10 MG tablet Take 1 tablet (10 mg total) by mouth daily. 03/07/18  Yes Delsa Grana, PA-C  fluticasone (FLONASE) 50 MCG/ACT nasal spray Place 2 sprays into both nostrils daily. 12/07/16  Yes [place], Modena Nunnery, MD  fluticasone [facility]) 50 MCG/ACT nasal spray Place 2 sprays into both nostrils daily. 03/07/18  Yes Delsa Grana, PA-C  Insulin Pen Needle (BD PEN NEEDLE MICRO U/F) 32G X 6 MM MISC Use as directed to inject Saxenda QD. 10/31/17  Yes [place], [place],  MD  levothyroxine (SYNTHROID, LEVOTHROID) 25 MCG tablet TAKE 1 TABLET (25 MCG TOTAL) BY MOUTH DAILY. 03/07/18  Yes [place], Modena Nunnery, MD  furosemide (LASIX) 20 MG tablet Take 1 tablet (20 mg total) by mouth daily as needed for fluid or edema (when taking also take potassium replacement). 07/17/18   Delsa Grana, PA-C  hydrochlorothiazide (HYDRODIURIL) 12.5 MG tablet Take 1 tablet (12.5 mg total) by mouth daily as needed. Patient not taking: Reported on 07/17/2018 06/06/17   Alycia Rossetti, MD  potassium chloride SA (K-DUR,KLOR-CON) 20 MEQ tablet Take 1 tablet (20 mEq total) by mouth daily as needed (take prn when taking  lasix). 07/17/18   Delsa Grana, PA-C  sertraline (ZOLOFT) 50 MG tablet Take 1 tablet (50 mg total) by mouth daily. 07/17/18   Delsa Grana, PA-C     No Known Allergies   Family History  Problem Relation Age of Onset  . Arthritis Mother   . Hypertension Mother   . Hyperlipidemia Father   . Hypertension Father   . Cancer Maternal Aunt        breast  . Breast cancer Maternal Aunt   . Cancer Paternal Aunt        breast  . Breast cancer Paternal Aunt   . Arthritis Maternal Grandmother   . Stroke Maternal Grandfather   . Diabetes Paternal Grandmother   . Heart disease Paternal Grandfather      Social History   Socioeconomic History  . Marital status: Single    Spouse name: Not on file  . Number of children: Not on file  . Years of education: Not on file  . Highest education level: Not on file  Occupational History    Employer: [employer]  . Financial resource strain: Not hard at all  . Food insecurity:    Worry: Never true    Inability: Never true  . Transportation needs:    Medical: No    Non-medical: No  Tobacco Use  . Smoking status: Never Smoker  . Smokeless tobacco: Never Used  Substance and Sexual Activity  . Alcohol use: Yes    Comment: occ  . Drug use: No  . Sexual activity: Yes  Lifestyle  . Physical activity:    Days per week: 2 days    Minutes per session: 30 min  . Stress: Rather much  Relationships  . Social connections:    Talks on phone: More than three times a week    Gets together: More than three times a week    Attends religious service: 1 to 4 times per year    Active member of club or organization: No    Attends meetings of clubs or organizations: Never    Relationship status: Married  . Intimate partner violence:    Fear of current or ex partner: No    Emotionally abused: No    Physically abused: No    Forced sexual activity: No  Other Topics Concern  . Not on file  Social History Narrative  . Not on file      Review of Systems  Constitutional: Negative.   HENT: Negative.   Eyes: Negative.   Respiratory: Negative.   Cardiovascular: Negative.   Gastrointestinal: Negative.   Endocrine: Negative.   Genitourinary: Negative.   Musculoskeletal: Negative.   Skin: Negative.   Allergic/Immunologic: Negative.   Neurological: Negative.   Hematological: Negative.   Psychiatric/Behavioral: Negative for self-injury, sleep disturbance and suicidal ideas. The patient is nervous/anxious.  All other systems reviewed and are negative.      Objective:    Vitals:   07/17/18 1609  BP: 112/62  Pulse: 86  Resp: 14  Temp: 98.1 F (36.7 C)  TempSrc: Oral  SpO2: 99%  Weight: 224 lb (101.6 kg)  Height: 5\' 2"  (1.575 m)      Physical Exam  Constitutional: She appears well-developed and well-nourished. No distress.  HENT:  Head: Normocephalic and atraumatic.  Nose: Nose normal.  Mouth/Throat: No oropharyngeal exudate.  Eyes: Conjunctivae are normal. Right eye exhibits no discharge. Left eye exhibits no discharge.  Neck: Normal range of motion. Neck supple. No tracheal deviation present.  Cardiovascular: Normal rate, regular rhythm, normal heart sounds and intact distal pulses. Exam reveals no gallop and no friction rub.  No murmur heard. Mild bilateral ankle edema, nonpitting, no pretibial edema  Pulmonary/Chest: Effort normal. No accessory muscle usage or stridor. No tachypnea. No respiratory distress. She has no decreased breath sounds. She has no wheezes. She has no rhonchi. She has no rales. She exhibits no tenderness, no bony tenderness, no crepitus, no edema and no swelling.  Musculoskeletal: Normal range of motion.       Right shoulder: Normal.  Neurological: She is alert. She exhibits normal muscle tone. Coordination normal.  Skin: Skin is warm and dry. No rash noted. She is not diaphoretic. No pallor.  Psychiatric: She has a normal mood and affect. Her behavior is normal.  Nursing  note and vitals reviewed.         Assessment & Plan:      ICD-10-CM   1. Dependent edema R60.9 furosemide (LASIX) 20 MG tablet    potassium chloride SA (K-DUR,KLOR-CON) 20 MEQ tablet   Elevate legs, use compression stockings, walk and exercise more, use sparingly 10 mg Lasix with potassium  2. GAD (generalized anxiety disorder) F41.1 sertraline (ZOLOFT) 25 MG tablet   agree to start meds again, 25 mg q d x 2w, then 50 mg qd x 4 weeks then follow up, go back to therapy       Delsa Grana, PA-C 07/17/18 4:26 PM

## 2018-07-17 NOTE — Patient Instructions (Addendum)
Get some over-the-counter compression stockings and wear when you have worse lower extremity swelling, use the diuretics for 2 to 3 days as needed it will help you pee off some of the fluid that is going to areas of low resistance with gravity which are your ankles and shins and feet.  Being active, elevating her legs, wearing compression stockings and avoiding salt in her diet can help with lower extremity swelling, some people have poor veins and venous blood return and they need to do these things frequently and use diuretics frequently.  We cannot refer you to a specialist until you do these basic things first to see if they are effective.  Restart Zoloft at 50 mg dose, please call me if you feel that this needs to be increased after 2 to 4 weeks but I would like to recheck you in about 6 weeks week and follow-up for 6-week recheck with your PCP.  Going to some kind of talk therapy or cognitive behavioral therapy as this is more effective with medications for treating anxiety symptoms

## 2018-07-19 MED FILL — FUROSEMIDE 20 MG TABS: 20 | 30 days supply | Qty: 30 | Fill #0

## 2018-07-19 MED FILL — POTASSIUM CL ER 20 MEQ TAB: 20 | 30 days supply | Qty: 30 | Fill #0

## 2018-07-29 MED FILL — FLUTICASONE PROP 50 MCG SPR: 50 | 30 days supply | Qty: 16 | Fill #2

## 2018-08-14 ENCOUNTER — Ambulatory Visit
Admission: RE | Admit: 2018-08-14 | Discharge: 2018-08-14 | Disposition: A | Discharge status: Home / Self Care | Payer: 59 | Source: Ambulatory Visit | Attending: Family Medicine | Admitting: Family Medicine

## 2018-08-14 DIAGNOSIS — Z1231 Encounter for screening mammogram for malignant neoplasm of breast: Secondary | ICD-10-CM

## 2018-09-27 MED FILL — FLUTICASONE PROP 50 MCG SPR: 50 | 90 days supply | Qty: 48 | Fill #3

## 2018-10-23 ENCOUNTER — Encounter: Payer: Self-pay | Admitting: Family Medicine

## 2018-10-23 ENCOUNTER — Ambulatory Visit (INDEPENDENT_AMBULATORY_CARE_PROVIDER_SITE_OTHER): Payer: No Typology Code available for payment source | Admitting: Family Medicine

## 2018-10-23 ENCOUNTER — Telehealth: Payer: Self-pay | Admitting: Family Medicine

## 2018-10-23 VITALS — BP 138/70 | HR 100 | Temp 98.4°F | Resp 16 | Ht 62.0 in | Wt 224.5 lb

## 2018-10-23 DIAGNOSIS — J329 Chronic sinusitis, unspecified: Secondary | ICD-10-CM

## 2018-10-23 DIAGNOSIS — J069 Acute upper respiratory infection, unspecified: Secondary | ICD-10-CM

## 2018-10-23 DIAGNOSIS — J310 Chronic rhinitis: Secondary | ICD-10-CM

## 2018-10-23 MED ORDER — MONTELUKAST SODIUM 10 MG PO TABS: 10.0000 mg | ORAL_TABLET | Freq: Every day | ORAL | 5 refills | Status: DC

## 2018-10-23 MED ORDER — IPRATROPIUM BROMIDE 0.03 % NA SOLN: 2.0000 | Freq: Two times a day (BID) | NASAL | 12 refills | Status: DC

## 2018-10-23 MED ORDER — FLUCONAZOLE 150 MG PO TABS: 150.0000 mg | ORAL_TABLET | Freq: Once | ORAL | 0 refills | Status: AC

## 2018-10-23 MED ORDER — AZELASTINE HCL 0.15 % NA SOLN: 2.0000 | Freq: Every day | NASAL | 0 refills | Status: DC | PRN

## 2018-10-23 MED ORDER — AMOXICILLIN-POT CLAVULANATE 875-125 MG PO TABS: 1.0000 | ORAL_TABLET | Freq: Two times a day (BID) | ORAL | 0 refills | Status: AC

## 2018-10-23 NOTE — Telephone Encounter (Signed)
She can try the ipratroprium nasal spray that I sent through to the pharmacy - if nothing is covered she can just try the sudafed, mucinex, use humidifier and saline nasal spray.

## 2018-10-23 NOTE — Progress Notes (Signed)
Patient ID: [name], female    DOB: 1971/07/04, 48 y.o.   MRN: [id number]  PCP: Alycia Rossetti, MD  Chief Complaint  Patient presents with  . Sinusitis    Patient in today with c/o sinus headache, congestion, and ear pain. Onset sunday.    Subjective:   [name] is a 48 y.o. female, presents to clinic with CC of sinus pain and pressure x 1-2 weeks, acutely worsening 4 days ago.  Her headache is much more severe and pressure in her sinuses forehead and temples is worse with thicker nasal discharge, scratchy throat.  She has tried over-the-counter medications, she usually is on a daily Claritin or Zyrtec this switches back and forth throughout the year when it seems to be ineffective, she is doing Flonase daily and has added on Tylenol cold and sinus medicine.  She has not used Sudafed.  She has a history of severe nasal allergies and used to have recurrent sinusitis but for the past 4 years she is done really well has not had antibiotics. She denies fever, chills, sweats.  She has mild nonproductive cough, she denies any body aches, rash, shortness of breath wheeze.   Patient Active Problem List   Diagnosis Date Noted  . Hypothyroidism 12/04/2016  . Morbid obesity ([employer]) 12/04/2016  . GERD (gastroesophageal reflux disease) 12/04/2016  . Chronic allergic rhinitis 12/04/2016  . Tinnitus 12/04/2016     Prior to Admission medications   Medication Sig Start Date End Date Taking? Authorizing Provider  Insulin Pen Needle (BD PEN NEEDLE MICRO U/F) 32G X 6 MM MISC Use as directed to inject Saxenda QD. 10/31/17  Yes Burton, Modena Nunnery, MD  levothyroxine (SYNTHROID, LEVOTHROID) 25 MCG tablet TAKE 1 TABLET (25 MCG TOTAL) BY MOUTH DAILY. 03/07/18  Yes Advance, Modena Nunnery, MD  loratadine (CLARITIN) 10 MG tablet Take 10 mg by mouth daily.   Yes [provider]  cetirizine (ZYRTEC ALLERGY) 10 MG tablet Take 1 tablet (10 mg total) by mouth daily. Patient not taking: Reported  on 10/23/2018 03/07/18   Delsa Grana, PA-C  fluticasone [facility]) 50 MCG/ACT nasal spray Place 2 sprays into both nostrils daily. 12/07/16   Alycia Rossetti, MD  furosemide (LASIX) 20 MG tablet Take 0.5 tablets (10 mg total) by mouth daily as needed for fluid or edema. Take with potassium PRN Patient not taking: Reported on 10/23/2018 07/17/18   Delsa Grana, PA-C  potassium chloride SA (K-DUR,KLOR-CON) 20 MEQ tablet Take 1 tablet (20 mEq total) by mouth daily. Patient not taking: Reported on 10/23/2018 07/17/18   Delsa Grana, PA-C  sertraline (ZOLOFT) 25 MG tablet Take 1 tablet (25 mg total) by mouth daily for 14 days, THEN 2 tablets (50 mg total) daily. 07/17/18 08/30/18  Delsa Grana, PA-C     No Known Allergies   Family History  Problem Relation Age of Onset  . Arthritis Mother   . Hypertension Mother   . Hyperlipidemia Father   . Hypertension Father   . Cancer Maternal Aunt        breast  . Breast cancer Maternal Aunt   . Cancer Paternal Aunt        breast  . Breast cancer Paternal Aunt   . Arthritis Maternal Grandmother   . Stroke Maternal Grandfather   . Diabetes Paternal Grandmother   . Heart disease Paternal Grandfather      Social History   Socioeconomic History  . Marital status: Single    Spouse  name: Not on file  . Number of children: Not on file  . Years of education: Not on file  . Highest education level: Not on file  Occupational History    Employer: [employer]  . Financial resource strain: Not hard at all  . Food insecurity:    Worry: Never true    Inability: Never true  . Transportation needs:    Medical: No    Non-medical: No  Tobacco Use  . Smoking status: Never Smoker  . Smokeless tobacco: Never Used  Substance and Sexual Activity  . Alcohol use: Yes    Comment: occ  . Drug use: No  . Sexual activity: Yes  Lifestyle  . Physical activity:    Days per week: 2 days    Minutes per session: 30 min  . Stress: Rather much    Relationships  . Social connections:    Talks on phone: More than three times a week    Gets together: More than three times a week    Attends religious service: 1 to 4 times per year    Active member of club or organization: No    Attends meetings of clubs or organizations: Never    Relationship status: Married  . Intimate partner violence:    Fear of current or ex partner: No    Emotionally abused: No    Physically abused: No    Forced sexual activity: No  Other Topics Concern  . Not on file  Social History Narrative  . Not on file     Review of Systems     Objective:    Vitals:   10/23/18 1039  BP: 138/70  Pulse: 100  Resp: 16  Temp: 98.4 F (36.9 C)  TempSrc: Oral  SpO2: 99%  Weight: 224 lb 8 oz (101.8 kg)  Height: 5\' 2"  (1.575 m)    92 HR  Physical Exam Vitals signs and nursing note reviewed.  Constitutional:      General: She is not in acute distress.    Appearance: She is well-developed. She is obese. She is not ill-appearing, toxic-appearing or diaphoretic.     Comments: Well appearing female  HENT:     Head: Normocephalic and atraumatic.     Right Ear: Hearing, tympanic membrane, ear canal and external ear normal. There is no impacted cerumen.     Left Ear: Hearing, tympanic membrane, ear canal and external ear normal. There is no impacted cerumen.     Nose: Mucosal edema (erythematous), congestion and rhinorrhea present. No nasal deformity. Rhinorrhea is clear.     Right Nostril: No occlusion.     Left Nostril: No occlusion.     Right Turbinates: Enlarged and swollen.     Left Turbinates: Enlarged and swollen.     Right Sinus: No maxillary sinus tenderness or frontal sinus tenderness.     Left Sinus: No maxillary sinus tenderness or frontal sinus tenderness.     Mouth/Throat:     Mouth: Mucous membranes are moist. Mucous membranes are not pale.     Pharynx: Uvula midline. Posterior oropharyngeal erythema (mild) present. No pharyngeal swelling,  oropharyngeal exudate or uvula swelling.     Tonsils: No tonsillar exudate or tonsillar abscesses. Swelling: 0 on the right. 0 on the left.  Eyes:     General:        Right eye: No discharge.        Left eye: No discharge.     Conjunctiva/sclera: Conjunctivae  normal.     Pupils: Pupils are equal, round, and reactive to light.  Neck:     Musculoskeletal: Normal range of motion and neck supple.     Trachea: No tracheal deviation.  Cardiovascular:     Rate and Rhythm: Normal rate and regular rhythm.     Pulses: Normal pulses.     Heart sounds: Normal heart sounds. No murmur. No friction rub. No gallop.   Pulmonary:     Effort: Pulmonary effort is normal. No respiratory distress.     Breath sounds: Normal breath sounds. No stridor. No wheezing, rhonchi or rales.  Chest:     Chest wall: No tenderness.  Abdominal:     General: Bowel sounds are normal. There is no distension.     Palpations: Abdomen is soft.  Musculoskeletal: Normal range of motion.  Lymphadenopathy:     Cervical: No cervical adenopathy.  Skin:    General: Skin is warm and dry.     Coloration: Skin is not pale.     Findings: No rash.  Neurological:     Mental Status: She is alert.     Motor: No abnormal muscle tone.     Coordination: Coordination normal.  Psychiatric:        Behavior: Behavior normal.           Assessment & Plan:   48 y/o female with uri sx for over a week, acutely worsening 4 days ago with more pressure, congestion and headaches.  On exam appears consistent with viral URI with baseline allergies, she is afebrile and has no sinus ttp - feel she may be able to continue supportive and symptomatic tx and wait and see if it improves - plan below    ICD-10-CM   1. Upper respiratory tract infection, unspecified type J06.9    add sudafed and mucinex, supportive and sx management, virus may pass, printed to HOLD abx if worseing or >10d  2. Rhinosinusitis J32.9    chronic, allergic and acutely worse  viral URI - add singular to zyrtec and flonase     abx - augmentin with diflucan prn for secondary yeast vaginitis. F/up if not improved in 1-2 weeks, or as needed for any worsening  Delsa Grana, PA-C 10/23/18 10:54 AM

## 2018-10-23 NOTE — Patient Instructions (Addendum)
Try sudafed - have to show you license  (if you start the antibiotic, you have to stop sudafed)  Can continue all other nasal sprays and OTC meds  Can add mucinex to dry you up.    HOLD the antibiotic for only if you have sudden worsening of sinus pain and pressure and fever

## 2018-10-23 NOTE — Addendum Note (Signed)
Addended by: Delsa Grana on: 10/23/2018 04:21 PM   Modules accepted: Orders

## 2018-10-23 NOTE — Telephone Encounter (Signed)
Azelastine  is not covered by patient's insurance. Please advise?

## 2018-10-24 ENCOUNTER — Encounter: Payer: Self-pay | Admitting: Family Medicine

## 2018-10-24 NOTE — Telephone Encounter (Signed)
Mychart message sent to patient.

## 2018-12-23 MED FILL — IPRATROPIUM 0.03% SPRAY: 0.03 | 43 days supply | Qty: 30 | Fill #0

## 2018-12-23 MED FILL — MONTELUKAST SOD 10 MG TAB: 10 | 30 days supply | Qty: 30 | Fill #0

## 2019-01-31 ENCOUNTER — Other Ambulatory Visit: Payer: Self-pay | Admitting: Family Medicine

## 2019-01-31 MED FILL — FLUTICASONE PROP 50 MCG SPR: 50 | 30 days supply | Qty: 16 | Fill #0

## 2019-03-12 ENCOUNTER — Encounter: Payer: Self-pay | Admitting: Family Medicine

## 2019-03-12 ENCOUNTER — Ambulatory Visit (INDEPENDENT_AMBULATORY_CARE_PROVIDER_SITE_OTHER): Payer: No Typology Code available for payment source | Admitting: Family Medicine

## 2019-03-12 ENCOUNTER — Other Ambulatory Visit: Payer: Self-pay

## 2019-03-12 VITALS — BP 126/84 | HR 72 | Temp 98.8°F | Resp 14 | Ht 62.0 in | Wt 222.0 lb

## 2019-03-12 DIAGNOSIS — Z0001 Encounter for general adult medical examination with abnormal findings: Secondary | ICD-10-CM

## 2019-03-12 DIAGNOSIS — J309 Allergic rhinitis, unspecified: Secondary | ICD-10-CM

## 2019-03-12 DIAGNOSIS — K219 Gastro-esophageal reflux disease without esophagitis: Secondary | ICD-10-CM

## 2019-03-12 DIAGNOSIS — Z113 Encounter for screening for infections with a predominantly sexual mode of transmission: Secondary | ICD-10-CM

## 2019-03-12 DIAGNOSIS — Z0000 Encounter for general adult medical examination without abnormal findings: Secondary | ICD-10-CM

## 2019-03-12 DIAGNOSIS — R600 Localized edema: Secondary | ICD-10-CM | POA: Insufficient documentation

## 2019-03-12 DIAGNOSIS — R609 Edema, unspecified: Secondary | ICD-10-CM | POA: Insufficient documentation

## 2019-03-12 DIAGNOSIS — E039 Hypothyroidism, unspecified: Secondary | ICD-10-CM

## 2019-03-12 DIAGNOSIS — Z124 Encounter for screening for malignant neoplasm of cervix: Secondary | ICD-10-CM

## 2019-03-12 LAB — WET PREP FOR TRICH, YEAST, CLUE

## 2019-03-12 MED ORDER — ESOMEPRAZOLE MAGNESIUM 40 MG PO CPDR: 40.0000 mg | DELAYED_RELEASE_CAPSULE | Freq: Every day | ORAL | 3 refills | Status: DC

## 2019-03-12 MED FILL — ESOMEPRAZOLE MAG DR 40 MG C: 40 | 30 days supply | Qty: 30 | Fill #0

## 2019-03-12 NOTE — Assessment & Plan Note (Signed)
She is making dietary changes Will incorporate more exercise to help daily routine and overall health

## 2019-03-12 NOTE — Assessment & Plan Note (Signed)
Check TFT ,continue synthroid

## 2019-03-12 NOTE — Progress Notes (Signed)
Subjective:    Patient ID: [name], female    DOB: 05-Aug-1971, 48 y.o.   MRN: [id number]  Patient presents for Gynecologic Exam (is fasting- family hx cervical cancer) Patient here for complete physical exam.  She is also due for Pap smear she has family history of cervical cancer therefore get screened every year. Request STD screening   Thyroidism she is taking levothyroxine 25 mcg daily.  Morbid obesity- FOr month of June, no sweets, no meats, increased water  meds and hisory reviewed   Allergic rhinitis she is on Singulair and antihistamine   Nexium- for reflux as needed, does note that she will get symptoms in the middle of the night feels a burning sensation of the throat.  Sometimes her symptoms come out of nowhere even when she has not eaten.  She was initially concerned it may have been her heart.  She feels like if she belches to relieve pressure it feels better.  She also tries avoid certain foods that may trigger it.  She is not sure she can start taking Nexium daily  Mammogram UTD Dec 2019   Peripheral edema- on lasix as needed L>R  Immunizations UTD   Menses regular    Review Of Systems:  GEN- denies fatigue, fever, weight loss,weakness, recent illness HEENT- denies eye drainage, change in vision, nasal discharge, CVS- denies chest pain, palpitations RESP- denies SOB, cough, wheeze ABD- denies N/V, change in stools, abd pain GU- denies dysuria, hematuria, dribbling, incontinence MSK- denies joint pain, muscle aches, injury Neuro- denies headache, dizziness, syncope, seizure activity       Objective:    BP 126/84   Pulse 72   Temp 98.8 F (37.1 C) (Oral)   Resp 14   Ht 5\' 2"  (1.575 m)   Wt 222 lb (100.7 kg)   LMP 02/17/2019 (Approximate) Comment: regular  SpO2 99%   BMI 40.60 kg/m  GEN- NAD, alert and oriented x3 HEENT- PERRL, EOMI, non injected sclera, pink conjunctiva, MMM, oropharynx clear Neck- Supple, no thyromegaly Breast- normal  symmetry, no nipple inversion,no nipple drainage, no nodules or lumps felt Nodes- no axillary nodes CVS- RRR, no murmur RESP-CTAB ABD-NABS,soft,NT,ND GU- normal external genitalia, vaginal mucosa pink and moist, cervix visualized no growth but stenosed with scarring  no blood form os, minimal thin clear discharge, no CMT, no ovarian masses, uterus normal size EXT- No edema Pulses- Radial, DP- 2+        Assessment & Plan:      Problem List Items Addressed This Visit      Unprioritized   Chronic allergic rhinitis   GERD (gastroesophageal reflux disease)    Take nexium daily see how symptoms improved If continued abd pain or changes despite treatment Would obtain RUQ [place] to look at gallbladder Doubt cardiac based on symptoms      Relevant Medications   esomeprazole (NEXIUM) 40 MG capsule   Hypothyroidism    Check TFT ,continue synthroid      Relevant Orders   TSH   T3, free   Morbid obesity ([place])    She is making dietary changes Will incorporate more exercise to help daily routine and overall health      Peripheral edema    Prn lasix       Other Visit Diagnoses    Routine general medical examination at a health care facility    -  Primary   CPE done, PAP Smear done   Relevant Orders   CBC with  Differential/Platelet   Comprehensive metabolic panel   Lipid panel   Screen for STD (sexually transmitted disease)       Relevant Orders   WET PREP FOR [place], YEAST, CLUE   C. trachomatis/N. gonorrhoeae RNA   HIV Antibody (routine testing w rflx)   Cervical cancer screening       Relevant Orders   Pap IG w/ reflex to HPV when ASC-U      Note: This dictation was prepared with Dragon dictation along with smaller phrase technology. Any transcriptional errors that result from this process are unintentional.

## 2019-03-12 NOTE — Assessment & Plan Note (Signed)
Take nexium daily see how symptoms improved If continued abd pain or changes despite treatment Would obtain RUQ [place] to look at gallbladder Doubt cardiac based on symptoms

## 2019-03-12 NOTE — Assessment & Plan Note (Signed)
Prn lasix  °

## 2019-03-13 LAB — COMPREHENSIVE METABOLIC PANEL
AG Ratio: 1.4 (calc) (ref 1.0–2.5)
ALT: 20 U/L (ref 6–29)
AST: 18 U/L (ref 10–35)
Albumin: 4.1 g/dL (ref 3.6–5.1)
Alkaline phosphatase (APISO): 55 U/L (ref 31–125)
BUN: 9 mg/dL (ref 7–25)
CO2: 24 mmol/L (ref 20–32)
Calcium: 9.3 mg/dL (ref 8.6–10.2)
Chloride: 105 mmol/L (ref 98–110)
Creat: 0.99 mg/dL (ref 0.50–1.10)
Globulin: 2.9 g/dL (calc) (ref 1.9–3.7)
Glucose, Bld: 78 mg/dL (ref 65–99)
Potassium: 4.3 mmol/L (ref 3.5–5.3)
Sodium: 137 mmol/L (ref 135–146)
Total Bilirubin: 0.4 mg/dL (ref 0.2–1.2)
Total Protein: 7 g/dL (ref 6.1–8.1)

## 2019-03-13 LAB — CBC WITH DIFFERENTIAL/PLATELET
Absolute Monocytes: 678 cells/uL (ref 200–950)
Basophils Absolute: 39 cells/uL (ref 0–200)
Basophils Relative: 0.7 %
Eosinophils Absolute: 112 cells/uL (ref 15–500)
Eosinophils Relative: 2 %
HCT: 34.5 % — ABNORMAL LOW (ref 35.0–45.0)
Hemoglobin: 11.1 g/dL — ABNORMAL LOW (ref 11.7–15.5)
Lymphs Abs: 1949 cells/uL (ref 850–3900)
MCH: 26 pg — ABNORMAL LOW (ref 27.0–33.0)
MCHC: 32.2 g/dL (ref 32.0–36.0)
MCV: 80.8 fL (ref 80.0–100.0)
MPV: 10.4 fL (ref 7.5–12.5)
Monocytes Relative: 12.1 %
Neutro Abs: 2822 cells/uL (ref 1500–7800)
Neutrophils Relative %: 50.4 %
Platelets: 360 10*3/uL (ref 140–400)
RBC: 4.27 10*6/uL (ref 3.80–5.10)
RDW: 14.4 % (ref 11.0–15.0)
Total Lymphocyte: 34.8 %
WBC: 5.6 10*3/uL (ref 3.8–10.8)

## 2019-03-13 LAB — C. TRACHOMATIS/N. GONORRHOEAE RNA
C. trachomatis RNA, TMA: NOT DETECTED
N. gonorrhoeae RNA, TMA: NOT DETECTED

## 2019-03-13 LAB — PAP IG W/ RFLX HPV ASCU

## 2019-03-13 LAB — HIV ANTIBODY (ROUTINE TESTING W REFLEX): HIV 1&2 Ab, 4th Generation: NONREACTIVE

## 2019-03-13 LAB — TSH: TSH: 3.05 mIU/L

## 2019-03-13 LAB — LIPID PANEL
Cholesterol: 157 mg/dL (ref <200)
HDL: 64 mg/dL (ref >=50)
LDL Cholesterol (Calc): 78 mg/dL (calc)
Non-HDL Cholesterol (Calc): 93 mg/dL (calc) (ref <130)
Total CHOL/HDL Ratio: 2.5 (calc) (ref <5.0)
Triglycerides: 70 mg/dL (ref <150)

## 2019-03-13 LAB — T3, FREE: T3, Free: 3.2 pg/mL (ref 2.3–4.2)

## 2019-03-14 ENCOUNTER — Other Ambulatory Visit: Payer: Self-pay | Admitting: Family Medicine

## 2019-03-14 MED FILL — MONTELUKAST SOD 10 MG TAB: 10 | 30 days supply | Qty: 30 | Fill #0

## 2019-03-17 ENCOUNTER — Other Ambulatory Visit: Payer: Self-pay | Admitting: *Deleted

## 2019-03-17 DIAGNOSIS — D649 Anemia, unspecified: Secondary | ICD-10-CM

## 2019-03-17 MED ORDER — TAB-A-VITE/IRON PO TABS: 1.0000 | ORAL_TABLET | Freq: Every day | ORAL | 0 refills | Status: AC

## 2019-03-17 MED FILL — FLUTICASONE PROP 50 MCG SPR: 50 | 30 days supply | Qty: 16 | Fill #0

## 2019-03-17 MED FILL — LEVOTHYROXINE 25 MCG TABLET: 25 | 90 days supply | Qty: 90 | Fill #0

## 2019-04-25 MED FILL — MONTELUKAST SOD 10 MG TAB: 10 | 30 days supply | Qty: 30 | Fill #1

## 2019-04-25 MED FILL — FLUTICASONE PROP 50 MCG SPR: 50 | 30 days supply | Qty: 16 | Fill #1

## 2019-05-12 MED FILL — ESOMEPRAZOLE MAG DR 40 MG C: 40 | 30 days supply | Qty: 30 | Fill #1

## 2019-06-16 MED FILL — ESOMEPRAZOLE MAG DR 40 MG C: 40 | 30 days supply | Qty: 30 | Fill #2

## 2019-06-16 MED FILL — FLUTICASONE PROP 50 MCG SPR: 50 | 30 days supply | Qty: 16 | Fill #2

## 2019-06-27 ENCOUNTER — Other Ambulatory Visit: Payer: Self-pay | Admitting: Family Medicine

## 2019-06-27 DIAGNOSIS — Z1231 Encounter for screening mammogram for malignant neoplasm of breast: Secondary | ICD-10-CM

## 2019-06-30 ENCOUNTER — Ambulatory Visit (INDEPENDENT_AMBULATORY_CARE_PROVIDER_SITE_OTHER): Payer: No Typology Code available for payment source

## 2019-06-30 ENCOUNTER — Other Ambulatory Visit: Payer: No Typology Code available for payment source

## 2019-06-30 ENCOUNTER — Other Ambulatory Visit: Payer: Self-pay

## 2019-06-30 DIAGNOSIS — D649 Anemia, unspecified: Secondary | ICD-10-CM

## 2019-06-30 DIAGNOSIS — Z23 Encounter for immunization: Secondary | ICD-10-CM

## 2019-07-01 LAB — CBC WITH DIFFERENTIAL/PLATELET
Absolute Monocytes: 750 cells/uL (ref 200–950)
Basophils Absolute: 68 cells/uL (ref 0–200)
Basophils Relative: 0.9 %
Eosinophils Absolute: 180 cells/uL (ref 15–500)
Eosinophils Relative: 2.4 %
HCT: 35.8 % (ref 35.0–45.0)
Hemoglobin: 11.7 g/dL (ref 11.7–15.5)
Lymphs Abs: 3563 cells/uL (ref 850–3900)
MCH: 27.9 pg (ref 27.0–33.0)
MCHC: 32.7 g/dL (ref 32.0–36.0)
MCV: 85.4 fL (ref 80.0–100.0)
MPV: 11.5 fL (ref 7.5–12.5)
Monocytes Relative: 10 %
Neutro Abs: 2940 cells/uL (ref 1500–7800)
Neutrophils Relative %: 39.2 %
Platelets: 362 10*3/uL (ref 140–400)
RBC: 4.19 10*6/uL (ref 3.80–5.10)
RDW: 13.7 % (ref 11.0–15.0)
Total Lymphocyte: 47.5 %
WBC: 7.5 10*3/uL (ref 3.8–10.8)

## 2019-07-01 LAB — IRON,TIBC AND FERRITIN PANEL
%SAT: 7 % (calc) — ABNORMAL LOW (ref 16–45)
Ferritin: 8 ng/mL — ABNORMAL LOW (ref 16–232)
Iron: 28 ug/dL — ABNORMAL LOW (ref 40–190)
TIBC: 377 mcg/dL (calc) (ref 250–450)

## 2019-08-18 ENCOUNTER — Ambulatory Visit: Payer: 59

## 2019-08-18 MED FILL — FLUTICASONE PROP 50 MCG SPR: 50 | 30 days supply | Qty: 16 | Fill #3

## 2019-08-18 MED FILL — MONTELUKAST SOD 10 MG TAB: 10 | 60 days supply | Qty: 60 | Fill #2

## 2019-08-18 MED FILL — IPRATROPIUM 0.03% SPRAY: 0.03 | 86 days supply | Qty: 60 | Fill #0

## 2019-09-30 ENCOUNTER — Encounter: Payer: Self-pay | Admitting: Family Medicine

## 2019-09-30 ENCOUNTER — Other Ambulatory Visit: Payer: Self-pay

## 2019-09-30 ENCOUNTER — Ambulatory Visit (INDEPENDENT_AMBULATORY_CARE_PROVIDER_SITE_OTHER): Payer: No Typology Code available for payment source | Admitting: Family Medicine

## 2019-09-30 VITALS — BP 128/78 | HR 92 | Temp 98.6°F | Resp 16 | Ht 62.0 in | Wt 226.0 lb

## 2019-09-30 DIAGNOSIS — R9431 Abnormal electrocardiogram [ECG] [EKG]: Secondary | ICD-10-CM

## 2019-09-30 DIAGNOSIS — Z8249 Family history of ischemic heart disease and other diseases of the circulatory system: Secondary | ICD-10-CM

## 2019-09-30 DIAGNOSIS — E039 Hypothyroidism, unspecified: Secondary | ICD-10-CM

## 2019-09-30 DIAGNOSIS — D508 Other iron deficiency anemias: Secondary | ICD-10-CM | POA: Diagnosis not present

## 2019-09-30 DIAGNOSIS — R079 Chest pain, unspecified: Secondary | ICD-10-CM | POA: Diagnosis not present

## 2019-09-30 DIAGNOSIS — K219 Gastro-esophageal reflux disease without esophagitis: Secondary | ICD-10-CM | POA: Diagnosis not present

## 2019-09-30 DIAGNOSIS — D509 Iron deficiency anemia, unspecified: Secondary | ICD-10-CM | POA: Insufficient documentation

## 2019-09-30 DIAGNOSIS — R1011 Right upper quadrant pain: Secondary | ICD-10-CM

## 2019-09-30 DIAGNOSIS — E669 Obesity, unspecified: Secondary | ICD-10-CM | POA: Diagnosis not present

## 2019-09-30 LAB — COMPREHENSIVE METABOLIC PANEL
AG Ratio: 1.3 (calc) (ref 1.0–2.5)
ALT: 18 U/L (ref 6–29)
AST: 18 U/L (ref 10–35)
Albumin: 4 g/dL (ref 3.6–5.1)
Alkaline phosphatase (APISO): 60 U/L (ref 31–125)
BUN: 12 mg/dL (ref 7–25)
CO2: 23 mmol/L (ref 20–32)
Calcium: 9.4 mg/dL (ref 8.6–10.2)
Chloride: 104 mmol/L (ref 98–110)
Creat: 0.92 mg/dL (ref 0.50–1.10)
Globulin: 3.2 g/dL (calc) (ref 1.9–3.7)
Glucose, Bld: 78 mg/dL (ref 65–99)
Potassium: 4.3 mmol/L (ref 3.5–5.3)
Sodium: 136 mmol/L (ref 135–146)
Total Bilirubin: 0.5 mg/dL (ref 0.2–1.2)
Total Protein: 7.2 g/dL (ref 6.1–8.1)

## 2019-09-30 LAB — CBC WITH DIFFERENTIAL/PLATELET
Absolute Monocytes: 701 cells/uL (ref 200–950)
Basophils Absolute: 50 cells/uL (ref 0–200)
Basophils Relative: 0.8 %
Eosinophils Absolute: 112 cells/uL (ref 15–500)
Eosinophils Relative: 1.8 %
HCT: 37.7 % (ref 35.0–45.0)
Hemoglobin: 12.2 g/dL (ref 11.7–15.5)
Lymphs Abs: 2003 cells/uL (ref 850–3900)
MCH: 27.9 pg (ref 27.0–33.0)
MCHC: 32.4 g/dL (ref 32.0–36.0)
MCV: 86.1 fL (ref 80.0–100.0)
MPV: 10.4 fL (ref 7.5–12.5)
Monocytes Relative: 11.3 %
Neutro Abs: 3336 cells/uL (ref 1500–7800)
Neutrophils Relative %: 53.8 %
Platelets: 323 10*3/uL (ref 140–400)
RBC: 4.38 10*6/uL (ref 3.80–5.10)
RDW: 13.5 % (ref 11.0–15.0)
Total Lymphocyte: 32.3 %
WBC: 6.2 10*3/uL (ref 3.8–10.8)

## 2019-09-30 LAB — IRON,TIBC AND FERRITIN PANEL
%SAT: 50 % — ABNORMAL HIGH (ref 16–45)
Ferritin: 38 ng/mL (ref 16–232)
Iron: 186 ug/dL (ref 40–190)
TIBC: 374 ug/dL (ref 250–450)

## 2019-09-30 NOTE — Progress Notes (Signed)
Subjective:    Patient ID: [name], female    DOB: [birth year], 49 y.o.   MRN: [id number]  Patient presents for Follow-up (is fasting) and Chest Pain (R upper chest pain- feels like it is gas related)  Patient here to follow-up chronic medical problems.  Medications reviewed. Does complain of chest discomfort in her right upper chest actually had this pain intermittently for some time.Marlana Salvage does have underlying GERD.  She is currently on Nexium.  At her last visit inJuly she was having increased reflux symptoms when I advised her to take a PPI daily to help with symptoms.  Nexium is helping her overall reflux.  She will still get severe episodes of chest discomfort.  Sometimes she can pinpoint when she may have missed her Nexium like last night.  this morning she got up and had severe pain beneath her shoulder blade, the pain started moving around as husband was trying to help her belch I massage the area.  She describes it as a sharp stabbing pain in her right upper chest that radiated to her shoulder blade.  Recheck her heart rate that was normal she did not feel short of breath.  She took an additional Beano which is now starting to kick in. She did have meatballs with sauce and tomoates on her salad last night and these things continue to aggravate her reflux.  She has tried to cut down caffiene, spicey foods but she seems to have constant digestive issues and now this chest pain  she has heart disease in grandparents   HypoThyroidism she is currently on levothyroxine 25 mcg  Obesity class III  Iron deficiency anemia she is on iron supplement once a day she is due for repeat iron studies.  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 Review Of Systems:  GEN- denies fatigue, fever, weight loss,weakness, recent  illness HEENT- denies eye drainage, change in vision, nasal discharge, CVS- +chest pain,  Denies palpitations RESP- denies SOB, cough, wheeze ABD- denies N/V, change in stools,+ abd pain GU- denies dysuria, hematuria, dribbling, incontinence MSK- denies joint pain, muscle aches, injury Neuro- denies headache, dizziness, syncope, seizure activity       Objective:    BP 128/78   Pulse 92   Temp 98.6 F (37 C) (Temporal)   Resp 16   Ht 5\' 2"  (1.575 m)   Wt 226 lb (102.5 kg)   SpO2 98%   BMI 41.34 kg/m  GEN- NAD, alert and oriented x3 HEENT- PERRL, EOMI, non injected sclera, pink conjunctiva,  Neck- Supple, no thyromegaly  CVS- RRR, no murmur RESP-CTAB ABD-NABS,soft,NT,ND, mild epigastric  MSK- Chest wall NT, tenderness over Right AC, FROM upper ext  EXT- No edema Pulses- Radial, DP- 2+  EKG- NSR, ? q waves in anterior lateral leads      Assessment & Plan:      Problem List Items Addressed This Visit      Unprioritized   Class 3 obesity   GERD (gastroesophageal reflux disease)    I think her symptoms are due to her gastrointestinal disease.  However her EKG was mildly abnormal possibly consistent overreading secondary to her habitus but she does have some family history of heart disease as well.  I will defer her to cardiology to evaluate.  Her EKG from  2 years ago was completely normal.  We will check her labs today.  We will also obtain right upper quadrant ultrasound that were about gallbladder etiology with severe intensity of the shoulder blade discomfort that she get known she has GI issues.  We will also refer her to GI for possible endoscopy.  She does have iron deficiency anemia though I think this is related to her menses as she has not had any blood in the stool.  In the meantime she will continue her Nexium she can add Gas-X to see if this helps with temporary relief.  She has foods that she is avoiding which helped control her symptoms.      Relevant Orders    Comprehensive metabolic panel   CBC with Differential/Platelet   US Abdomen Limited RUQ   Ambulatory referral to Gastroenterology   Hypothyroidism - Primary    TFTs at goal      Iron deficiency anemia   Relevant Orders   CBC with Differential/Platelet   Iron, TIBC and Ferritin Panel    Other Visit Diagnoses    RUQ pain       Relevant Orders   US Abdomen Limited RUQ   Ambulatory referral to Gastroenterology   Chest pain, unspecified type       Relevant Orders   EKG 12-Lead (Completed)   Ambulatory referral to Cardiology   Abnormal EKG       Relevant Orders   Ambulatory referral to Cardiology   Family history of heart disease       Relevant Orders   Ambulatory referral to Cardiology      Note: This dictation was prepared with Dragon dictation along with smaller phrase technology. Any transcriptional errors that result from this process are unintentional.

## 2019-09-30 NOTE — Assessment & Plan Note (Signed)
TFTs at goal

## 2019-09-30 NOTE — Patient Instructions (Addendum)
Referral Dr. Collene Mares Referral to cardiology for chest pain/EKG changes RUQ ultrasound to r/u gallbladder disease  We will call with lab results  F/U 6 months for Physical

## 2019-09-30 NOTE — Assessment & Plan Note (Signed)
I think her symptoms are due to her gastrointestinal disease.  However her EKG was mildly abnormal possibly consistent overreading secondary to her habitus but she does have some family history of heart disease as well.  I will defer her to cardiology to evaluate.  Her EKG from 2 years ago was completely normal.  We will check her labs today.  We will also obtain right upper quadrant ultrasound that were about gallbladder etiology with severe intensity of the shoulder blade discomfort that she get known she has GI issues.  We will also refer her to GI for possible endoscopy.  She does have iron deficiency anemia though I think this is related to her menses as she has not had any blood in the stool.  In the meantime she will continue her Nexium she can add Gas-X to see if this helps with temporary relief.  She has foods that she is avoiding which helped control her symptoms.

## 2019-10-06 NOTE — Progress Notes (Signed)
Patient referred by Alycia Rossetti, MD for hypertension, chest pain.  Subjective:   [name], female    DOB: 1970/11/11, 49 y.o.   MRN: [id number]   Chief Complaint  Patient presents with  . Chest Pain  . family history of heart problems  . New Patient (Initial Visit)     HPI  49 y.o. African-American female with chest pain, elevated blood pressure.  Patient works at [employer].  She has a sedentary job.  She does not do any regular physical activity.  Recently, she has had episodes of retrosternal, gas-like sensation, lasting for barely a second or so.  She does not have any exertional chest pain.  She does have family history of atherosclerotic disease, with both her maternal and paternal grandfathers having had stroke.  She is concerned about her cardiac risk.  Her blood pressure is elevated today.  She has noticed gradual decrease in her physical activity during the pandemic, correlating with increasing blood pressures.   Past Medical History:  Diagnosis Date  . Acid reflux   . Anxiety   . Panic attack   . Thyroid disease   . Tinnitus      Past Surgical History:  Procedure Laterality Date  . BREAST BIOPSY       Social History   Tobacco Use  Smoking Status Never Smoker  Smokeless Tobacco Never Used    Social History   Substance and Sexual Activity  Alcohol Use Yes   Comment: occ     Family History  Problem Relation Age of Onset  . Arthritis Mother   . Hypertension Mother   . Hyperlipidemia Father   . Hypertension Father   . Cancer Maternal Aunt        breast  . Breast cancer Maternal Aunt   . Cancer Paternal Aunt        breast  . Breast cancer Paternal Aunt   . Arthritis Maternal Grandmother   . Stroke Maternal Grandfather   . Diabetes Paternal Grandmother   . Heart disease Paternal Grandfather      Current Outpatient Medications on File Prior to Visit  Medication Sig Dispense Refill  . Cholecalciferol (VITAMIN D3) 50 MCG (2000 UT)  CHEW Chew 2 capsules by mouth daily.    Marland Kitchen esomeprazole (NEXIUM) 40 MG capsule Take 1 capsule (40 mg total) by mouth daily. 30 capsule 3  . fluticasone (FLONASE) 50 MCG/ACT nasal spray USE 2 SPRAYS IN EACH NOSTRIL DAILY. 16 g 3  . furosemide (LASIX) 20 MG tablet Take 0.5 tablets (10 mg total) by mouth daily as needed for fluid or edema. Take with potassium PRN 30 tablet 0  . ipratropium (ATROVENT) 0.03 % nasal spray Place 2 sprays into both nostrils every 12 (twelve) hours. 30 mL 12  . Lactobacillus Rhamnosus, GG, (CULTURELLE PO) Take 1 tablet by mouth daily.    Marland Kitchen levothyroxine (SYNTHROID) 25 MCG tablet TAKE 1 TABLET (25 MCG TOTAL) BY MOUTH DAILY. 90 tablet 1  . loratadine (CLARITIN) 10 MG tablet Take 10 mg by mouth daily.    . montelukast (SINGULAIR) 10 MG tablet Take 1 tablet (10 mg total) by mouth at bedtime. 30 tablet 5  . Multiple Vitamins-Iron (MULTIVITAMINS WITH IRON) TABS tablet Take 1 tablet by mouth daily.  0  . potassium chloride SA (K-DUR,KLOR-CON) 20 MEQ tablet Take 1 tablet (20 mEq total) by mouth daily. 30 tablet 3  . Simethicone (GAS-X EXTRA STRENGTH) 125 MG CAPS Take 2 capsules by mouth daily  as needed.    . vitamin B-12 (CYANOCOBALAMIN) 1000 MCG tablet Take 1,000 mcg by mouth 2 (two) times daily.     No current facility-administered medications on file prior to visit.    Cardiovascular and other pertinent studies:   EKG 10/08/2019: Sinus tachycardia 101 bpm.  Low voltage in precordial leads.  Inferior T wave inversion, consider ischemia.    Recent labs: 09/30/2019: Glucose 78, BUN/Cr 12/0.92. Na/K 136/4.3. Rest of the CMP normal H/H 12.2/37.7. MCV 86.1. Platelets 323  03/12/2019: Chol: 157, TG 70, HDL 64, LDL 78 TSH 3.05 normal     Review of Systems  Cardiovascular: Positive for chest pain (Nonanginal). Negative for dyspnea on exertion, leg swelling, palpitations and syncope.         Vitals:   10/08/19 1018 10/08/19 1025  BP: (!) 153/89 (!) 145/80    Pulse: (!) 103 94  Temp: 98 F (36.7 C)   SpO2: 100%      Body mass index is 42.32 kg/m. Filed Weights   10/08/19 1018  Weight: 224 lb (101.6 kg)     Objective:   Physical Exam  Constitutional: She appears well-developed and well-nourished.  Neck: No JVD present.  Cardiovascular: Normal rate, regular rhythm, normal heart sounds and intact distal pulses.  No murmur heard. Pulmonary/Chest: Effort normal and breath sounds normal. She has no wheezes. She has no rales.  Musculoskeletal:        General: No edema.  Nursing note and vitals reviewed.        Assessment & Recommendations:   49 y.o. African-American female with chest pain, elevated blood pressure.  Chest pain is nonanginal.  Given her family history, I will recommend calcium score scan for stratification.  Blood pressure is elevated without any established diagnosis of hypertension. Recommend echocardiogram. I recommend regular physical activity and weight loss.  If her blood pressure remains >130/85 mmHg over the next month or so in spite of diet and lifestyle modifications, could add low-dose diuretic or amlodipine.  I will see her on as-needed basis, unless significant abnormalities found on above testing.  Thank you for referring the patient to [place]. Please feel free to contact with any questions.  Nigel Mormon, MD [facility] Pager: [phone] Office: [phone]

## 2019-10-07 ENCOUNTER — Other Ambulatory Visit: Payer: Self-pay

## 2019-10-08 ENCOUNTER — Encounter: Payer: Self-pay | Admitting: Cardiology

## 2019-10-08 ENCOUNTER — Ambulatory Visit (INDEPENDENT_AMBULATORY_CARE_PROVIDER_SITE_OTHER): Payer: No Typology Code available for payment source | Admitting: Cardiology

## 2019-10-08 ENCOUNTER — Other Ambulatory Visit: Payer: Self-pay

## 2019-10-08 VITALS — BP 145/80 | HR 94 | Temp 98.0°F | Ht 61.0 in | Wt 224.0 lb

## 2019-10-08 DIAGNOSIS — R030 Elevated blood-pressure reading, without diagnosis of hypertension: Secondary | ICD-10-CM | POA: Insufficient documentation

## 2019-10-08 DIAGNOSIS — Z8249 Family history of ischemic heart disease and other diseases of the circulatory system: Secondary | ICD-10-CM

## 2019-10-08 DIAGNOSIS — R079 Chest pain, unspecified: Secondary | ICD-10-CM | POA: Diagnosis not present

## 2019-10-09 ENCOUNTER — Ambulatory Visit
Admission: RE | Admit: 2019-10-09 | Discharge: 2019-10-09 | Disposition: A | Discharge status: Home / Self Care | Payer: No Typology Code available for payment source | Source: Ambulatory Visit | Attending: Family Medicine | Admitting: Family Medicine

## 2019-10-09 DIAGNOSIS — R1011 Right upper quadrant pain: Secondary | ICD-10-CM

## 2019-10-09 DIAGNOSIS — K219 Gastro-esophageal reflux disease without esophagitis: Secondary | ICD-10-CM

## 2019-10-10 ENCOUNTER — Other Ambulatory Visit: Payer: Self-pay | Admitting: Family Medicine

## 2019-10-10 MED FILL — ESOMEPRAZOLE MAG DR 40 MG C: 40 | 30 days supply | Qty: 30 | Fill #3

## 2019-10-13 MED FILL — FLUTICASONE PROP 50 MCG SPR: 50 | 30 days supply | Qty: 16 | Fill #0

## 2019-10-15 ENCOUNTER — Other Ambulatory Visit: Payer: No Typology Code available for payment source

## 2019-10-20 ENCOUNTER — Other Ambulatory Visit: Payer: Self-pay

## 2019-10-20 ENCOUNTER — Ambulatory Visit (INDEPENDENT_AMBULATORY_CARE_PROVIDER_SITE_OTHER): Payer: No Typology Code available for payment source

## 2019-10-20 DIAGNOSIS — R030 Elevated blood-pressure reading, without diagnosis of hypertension: Secondary | ICD-10-CM

## 2019-10-20 DIAGNOSIS — R079 Chest pain, unspecified: Secondary | ICD-10-CM

## 2019-10-28 NOTE — Progress Notes (Signed)
Called pt no answer, left a vm

## 2019-10-31 NOTE — Progress Notes (Signed)
Called pt to inform her about result. Pt understood

## 2019-11-05 ENCOUNTER — Other Ambulatory Visit: Payer: Self-pay

## 2019-11-05 ENCOUNTER — Ambulatory Visit
Admission: RE | Admit: 2019-11-05 | Discharge: 2019-11-05 | Disposition: A | Discharge status: Home / Self Care | Payer: No Typology Code available for payment source | Source: Ambulatory Visit | Attending: Family Medicine | Admitting: Family Medicine

## 2019-11-05 DIAGNOSIS — Z1231 Encounter for screening mammogram for malignant neoplasm of breast: Secondary | ICD-10-CM

## 2019-11-11 ENCOUNTER — Encounter: Payer: Self-pay | Admitting: Chiropractic Medicine

## 2019-11-11 ENCOUNTER — Telehealth: Payer: Self-pay | Admitting: *Deleted

## 2019-11-11 MED FILL — FLUTICASONE PROP 50 MCG SPR: 50 | 30 days supply | Qty: 16 | Fill #1

## 2019-11-11 NOTE — Telephone Encounter (Signed)
Received call from patient.   Reports that she would like to start Zoloft at this time. Reports that she did not start medication when it was last prescribed, but she feels that she may need it at this time.   Call placed to patient. Advised that OV will be required prior to prescribing medication per VM

## 2019-11-11 NOTE — Progress Notes (Signed)
11/10/2019: EXAM: Limited Cardiac CT for calcium scoring.  TECHNIQUE: A high resolution CT of the heart and coronary arteries were performed using a multislice scanner with cardiac gating and collimation.  INDICATION: Family history of ischemic heart disease  COMPARISON:None  FINDINGS:  TOTAL CALCIUM SCORE :  0 CARDIAC ANATOMY:Normal. VISUALIZED THORAX:Unremarkable.

## 2019-11-18 ENCOUNTER — Other Ambulatory Visit: Payer: Self-pay | Admitting: Family Medicine

## 2019-11-18 ENCOUNTER — Other Ambulatory Visit: Payer: Self-pay

## 2019-11-18 ENCOUNTER — Ambulatory Visit (INDEPENDENT_AMBULATORY_CARE_PROVIDER_SITE_OTHER): Payer: No Typology Code available for payment source | Admitting: Family Medicine

## 2019-11-18 ENCOUNTER — Encounter: Payer: Self-pay | Admitting: Family Medicine

## 2019-11-18 DIAGNOSIS — F411 Generalized anxiety disorder: Secondary | ICD-10-CM

## 2019-11-18 DIAGNOSIS — F4321 Adjustment disorder with depressed mood: Secondary | ICD-10-CM | POA: Diagnosis not present

## 2019-11-18 MED ORDER — MONTELUKAST SODIUM 10 MG PO TABS: 10.0000 mg | ORAL_TABLET | Freq: Every day | ORAL | 2 refills | Status: DC

## 2019-11-18 MED ORDER — LEVOTHYROXINE SODIUM 25 MCG PO TABS: 25.0000 ug | ORAL_TABLET | Freq: Every day | ORAL | 1 refills | Status: DC

## 2019-11-18 MED ORDER — ESOMEPRAZOLE MAGNESIUM 40 MG PO CPDR: 40.0000 mg | DELAYED_RELEASE_CAPSULE | Freq: Every day | ORAL | 2 refills | Status: DC

## 2019-11-18 MED ORDER — SERTRALINE HCL 50 MG PO TABS: 50.0000 mg | ORAL_TABLET | Freq: Every day | ORAL | 3 refills | Status: DC

## 2019-11-18 MED FILL — SERTRALINE HCL 50 MG TABLET: 50 | 30 days supply | Qty: 30 | Fill #0

## 2019-11-18 MED FILL — ESOMEPRAZOLE MAG DR 40 MG C: 40 | 90 days supply | Qty: 90 | Fill #0

## 2019-11-18 MED FILL — LEVOTHYROXINE SODIUM 25 MCG: 25 | 90 days supply | Qty: 90 | Fill #0

## 2019-11-18 MED FILL — MONTELUKAST SOD 10 MG TAB: 10 | 90 days supply | Qty: 90 | Fill #0

## 2019-11-18 NOTE — Progress Notes (Signed)
   Subjective:    Patient ID: [name], female    DOB: [birth year], 49 y.o.   MRN: [id number]  Patient presents for Medication Review (Zoloft)  Patient here to follow-up chronic medical problems.  At her last visit she was having reflux issue as well as chest discomfort.  She was referred to gastroenterology as well as cardiology due to family history. Reviewed cardiology note.  She had calcium score scan for risk ratification.  Blood pressure was also elevated at 145/80.  Weight loss was encouraged. Calcium score was a 0 on the scan, ECHO was normal  With regards to acid reflux she is still on Nexium  40mg  once a daywas evaluated by Dr. Collene Mares, has colonoscopy scheduled for May.   Regards to some stressors.  Want to start Zoloft, she has been on in the past due to anxiety and panic attacks., She has been trying to self treat with stress reduction, exercise, but not improving. She feels herself slipping into a depressed mood, doesn't have any interest in her activites. She feels like she could stay in the house all day. Her family has noticed the decline in her mood as well. She knows the pandemic is a part of itbut she is fearful of mental health deteriorating so low, like it did years ago   she did do a few sessions with [employer] with her job, but did not feel like she benefitted from them   Review Of Systems:  GEN- denies fatigue, fever, weight loss,weakness, recent illness HEENT- denies eye drainage, change in vision, nasal discharge, CVS- denies chest pain, palpitations RESP- denies SOB, cough, wheeze ABD- denies N/V, change in stools, abd pain GU- denies dysuria, hematuria, dribbling, incontinence MSK- denies joint pain, muscle aches, injury Neuro- denies headache, dizziness, syncope, seizure activity       Objective:    BP 132/78   Pulse 92   Temp 99 F (37.2 C) (Temporal)   Resp 14   Ht 5\' 1"  (1.549 m)   Wt 227 lb (103 kg)   SpO2 98%   BMI 42.89 kg/m  GEN- NAD, alert  and oriented x3 Psych- normal affect and mood, no SI, well groomed, good speech, normal thought process         Assessment & Plan:      Problem List Items Addressed This Visit      Unprioritized   GAD (generalized anxiety disorder)    Restart zoloft, has tolerated in the past Referral for psychotherapy Start 25mg , titrate to  50mg  in 2 weeks      Relevant Medications   sertraline (ZOLOFT) 50 MG tablet   Other Relevant Orders   Ambulatory referral to Psychology   Situational depression   Relevant Medications   sertraline (ZOLOFT) 50 MG tablet   Other Relevant Orders   Ambulatory referral to Psychology      Note: This dictation was prepared with Dragon dictation along with smaller phrase technology. Any transcriptional errors that result from this process are unintentional.

## 2019-11-18 NOTE — Patient Instructions (Addendum)
Referral to therapy  Start zoloft 25mg  once a day, for 2 weeks, then increase to 50mg  once a day  F/U 4 weeks Mychart Telehealth

## 2019-11-19 ENCOUNTER — Encounter: Payer: Self-pay | Admitting: Family Medicine

## 2019-11-19 NOTE — Assessment & Plan Note (Signed)
Restart zoloft, has tolerated in the past Referral for psychotherapy Start 25mg , titrate to  50mg  in 2 weeks

## 2019-12-16 ENCOUNTER — Telehealth (INDEPENDENT_AMBULATORY_CARE_PROVIDER_SITE_OTHER): Payer: No Typology Code available for payment source | Admitting: Family Medicine

## 2019-12-16 ENCOUNTER — Encounter: Payer: Self-pay | Admitting: Family Medicine

## 2019-12-16 DIAGNOSIS — F411 Generalized anxiety disorder: Secondary | ICD-10-CM

## 2019-12-16 DIAGNOSIS — F4321 Adjustment disorder with depressed mood: Secondary | ICD-10-CM | POA: Diagnosis not present

## 2019-12-16 NOTE — Progress Notes (Signed)
Virtual Visit via Video Note  I connected with [name] on 12/16/19 at 3:39pm by a video enabled telemedicine application and verified that I am speaking with the correct person using two identifiers.      Pt location: at home   Physician location:  In office, [facility], Vic Blackbird MD     On call: patient and physician   I discussed the limitations of evaluation and management by telemedicine and the availability of in person appointments. The patient expressed understanding and agreed to proceed.  History of Present Illness: Telehealth visit to follow-up Zoloft medication for anxiety and depression symptoms.  She was started on Zoloft 25 mg and titrated up to 50 mg at her last visit 4 weeks ago.  She has been on the 50 mg for the past week and a half.  Only side effect she is really noted was nausea initially but that improved with taking with food but then she noted at the 50 mg dose and made her more sleepy when she was taking it midday.  Yesterday she took around 6 PM and did not have the sedating effects.  She is noting improvement in her overall mood she does not feel on edge all the time.  Anxiety has definitely decreased.  She has not felt panicky at all.  She is also sleeping much better she feels well rested in the morning time.  Her husband is also noted that things seem to roll off her shoulders now consider her getting worked up.  She is completing the paperwork for psychology does not have an appointment just yet.   Observations/Objective: No acute distress noted over video.  Normal speech, affect and mood. Normal work of breathing  Assessment and Plan: Generalized anxiety with situational depression.  I will continue Zoloft at 50 mg we will move this to bedtime to help with the sedating effects as she is getting improvement in her general symptoms.  She will follow through with psychology for therapy.  We will recheck her in the office at her scheduled  physical exam which is in 2 to 3 months.  If she has any problems with her medications between then she will contact me and we will adjust as needed.  Follow Up Instructions:    I discussed the assessment and treatment plan with the patient. The patient was provided an opportunity to ask questions and all were answered. The patient agreed with the plan and demonstrated an understanding of the instructions.   The patient was advised to call back or seek an in-person evaluation if the symptoms worsen or if the condition fails to improve as anticipated.  I provided 10 minutes of non-face-to-face time during this encounter. End Time 3:49pm  Vic Blackbird, MD

## 2019-12-26 MED FILL — SERTRALINE HCL 50 MG TABLET: 50 | 30 days supply | Qty: 30 | Fill #1

## 2019-12-28 IMAGING — MG DIGITAL SCREENING BILATERAL MAMMOGRAM WITH TOMO AND CAD
8 series · 8 of 24 positions shown · non-contrast
Comparison: Previous exam(s).

CLINICAL DATA: Screening.

EXAM:
DIGITAL SCREENING BILATERAL MAMMOGRAM WITH TOMO AND CAD

[R MLO synth-2D]
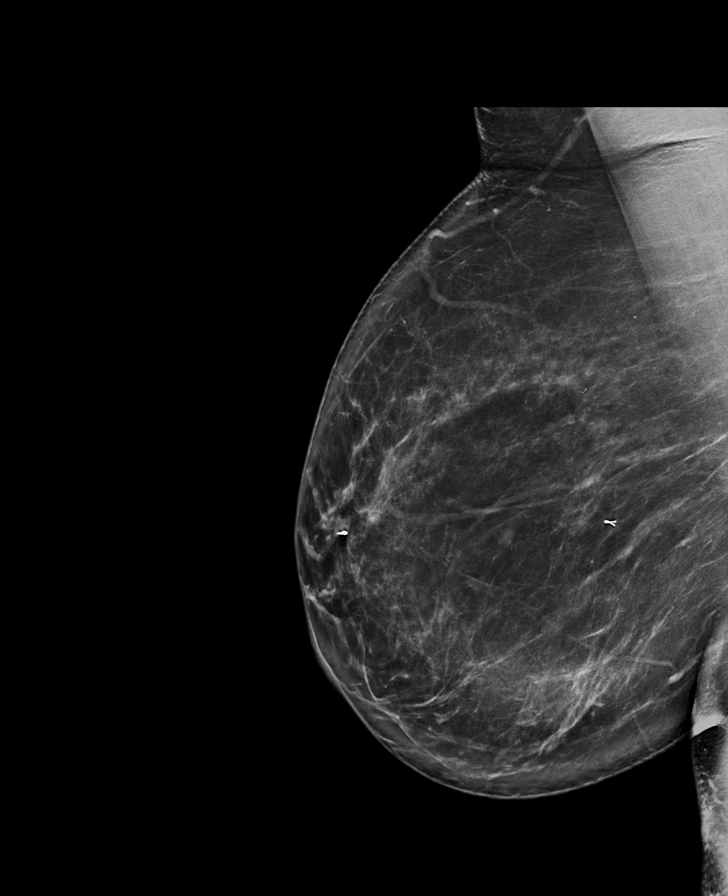

[R CC synth-2D]
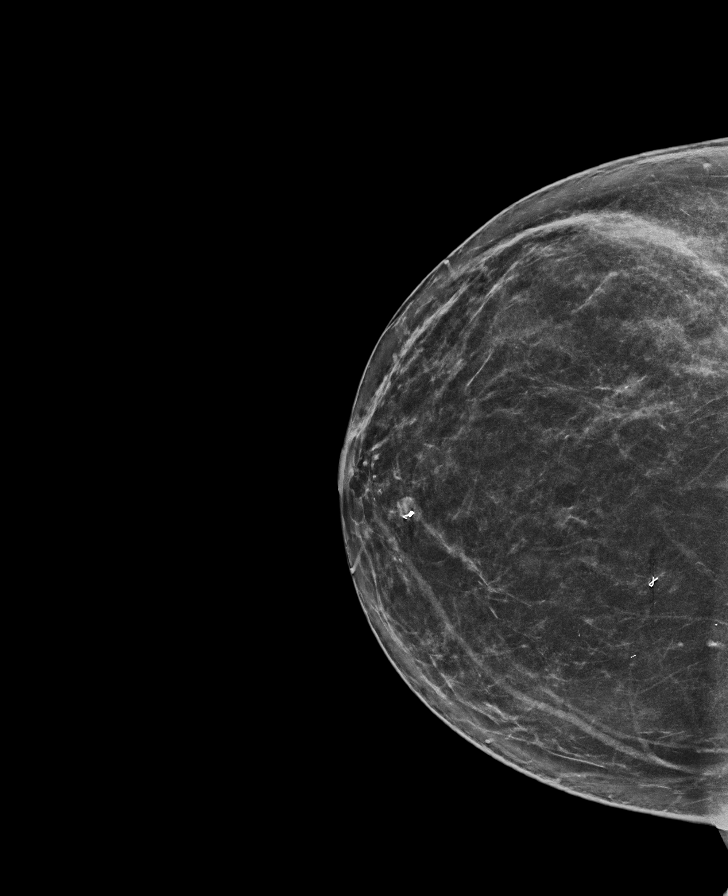

[L CC synth-2D]
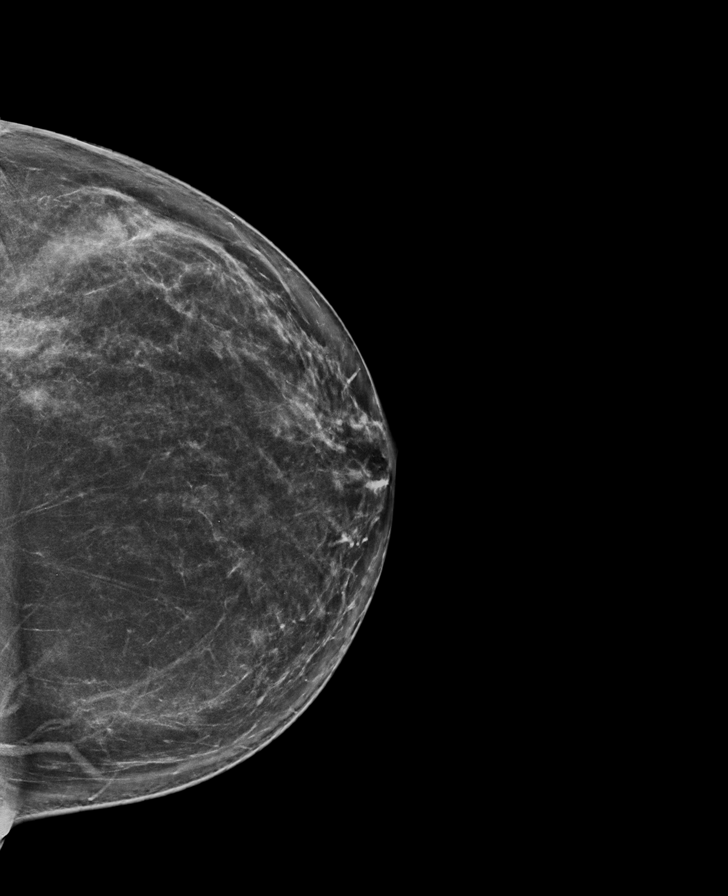

[L MLO synth-2D]
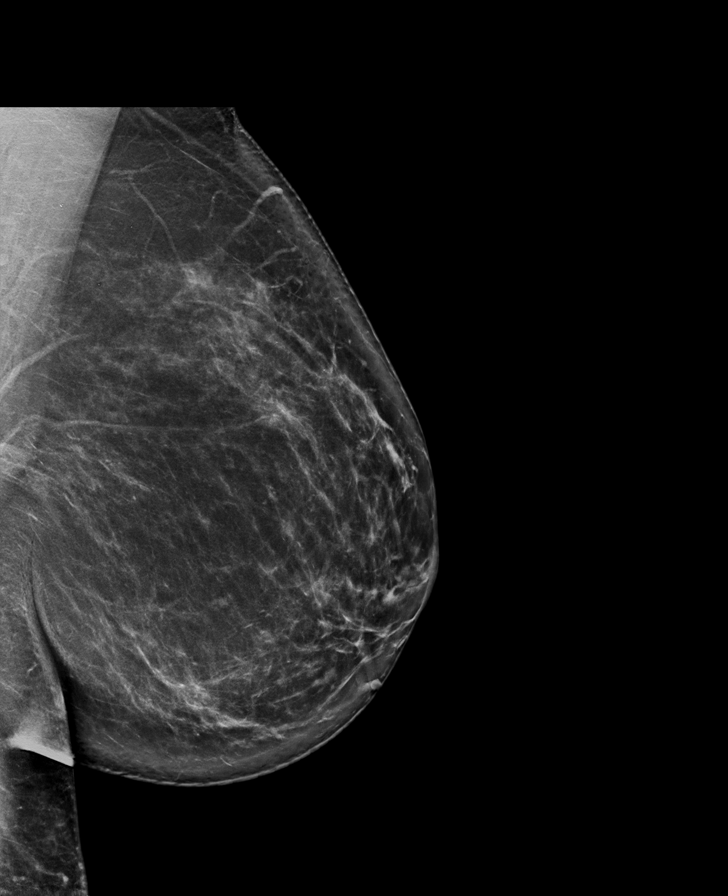

[L MLO tomo · tomo slice 47/94.0]
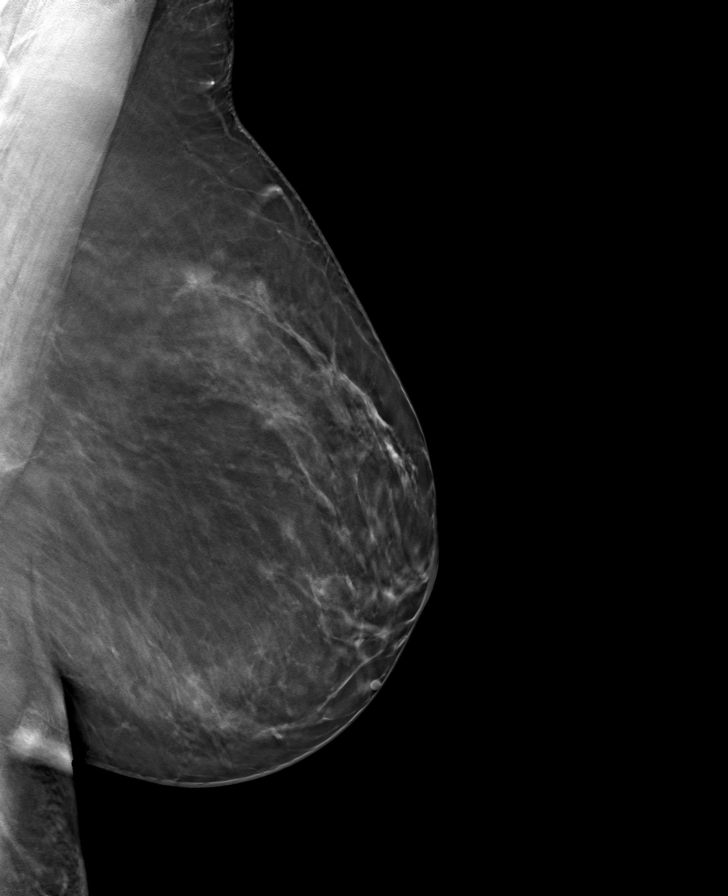

[R CC tomo · tomo slice 39/78.0]
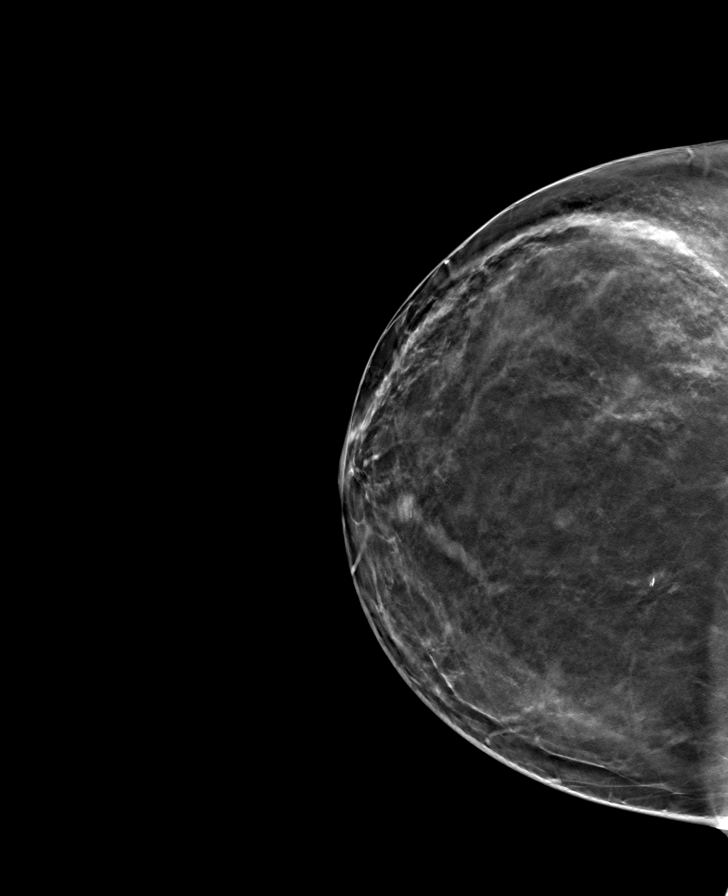

[L CC tomo · tomo slice 43/84.0]
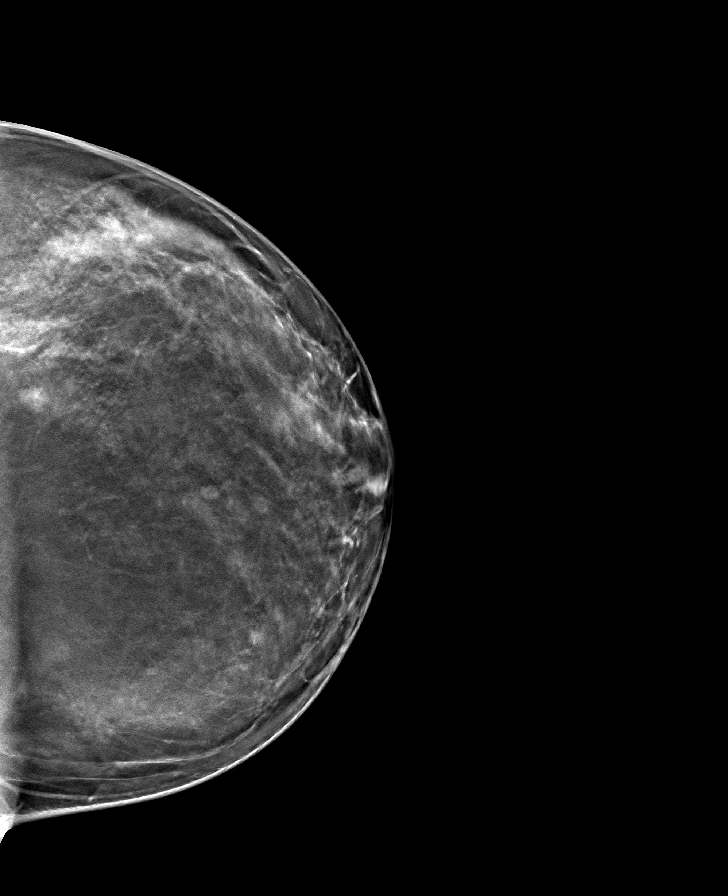

[R MLO tomo · tomo slice 49/96.0]
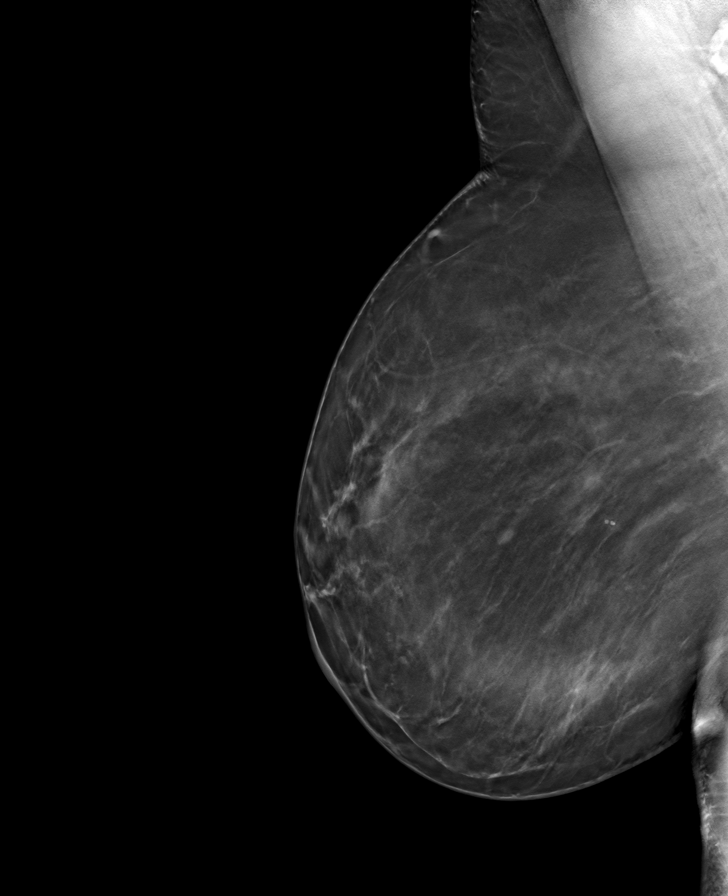

[8 of 24 positions shown; findings below may reference images not displayed]

ACR Breast Density Category b: There are scattered areas of
fibroglandular density.
FINDINGS: There are no findings suspicious for malignancy. Images were
processed with CAD.
IMPRESSION: No mammographic evidence of malignancy. A result letter of this
screening mammogram will be mailed directly to the patient.

RECOMMENDATION:
Screening mammogram in one year. (Code:[id number])

BI-RADS CATEGORY  1: Negative.

## 2020-01-06 MED FILL — FLUTICASONE PROP 50 MCG SPR: 50 | 30 days supply | Qty: 16 | Fill #2

## 2020-01-09 MED FILL — SUPREP BOWEL PREP KIT: 17.5-3.13-1 | 1 days supply | Qty: 354 | Fill #0

## 2020-01-28 MED FILL — SERTRALINE HCL 50 MG TABLET: 50 | 30 days supply | Qty: 30 | Fill #2

## 2020-02-24 MED FILL — FLUTICASONE PROP 50 MCG SPR: 50 | 30 days supply | Qty: 16 | Fill #3

## 2020-02-26 ENCOUNTER — Other Ambulatory Visit: Payer: Self-pay | Admitting: Family Medicine

## 2020-02-26 MED FILL — IPRATROPIUM 0.03% SPRAY: 0.03 | 86 days supply | Qty: 60 | Fill #0

## 2020-03-09 MED FILL — SERTRALINE HCL 50 MG TABLET: 50 | 30 days supply | Qty: 30 | Fill #3

## 2020-03-09 MED FILL — IPRATROPIUM 0.03% SPRAY: 0.03 | 86 days supply | Qty: 60 | Fill #0

## 2020-03-16 ENCOUNTER — Encounter: Payer: No Typology Code available for payment source | Admitting: Family Medicine

## 2020-03-31 ENCOUNTER — Encounter: Payer: No Typology Code available for payment source | Admitting: Family Medicine

## 2020-04-07 ENCOUNTER — Other Ambulatory Visit: Payer: Self-pay

## 2020-04-07 ENCOUNTER — Other Ambulatory Visit: Payer: Self-pay | Admitting: Family Medicine

## 2020-04-07 ENCOUNTER — Ambulatory Visit (INDEPENDENT_AMBULATORY_CARE_PROVIDER_SITE_OTHER): Payer: No Typology Code available for payment source | Admitting: Family Medicine

## 2020-04-07 ENCOUNTER — Encounter: Payer: Self-pay | Admitting: Family Medicine

## 2020-04-07 VITALS — BP 134/72 | HR 94 | Temp 98.1°F | Resp 16 | Ht 61.0 in | Wt 223.0 lb

## 2020-04-07 DIAGNOSIS — E669 Obesity, unspecified: Secondary | ICD-10-CM

## 2020-04-07 DIAGNOSIS — Z0001 Encounter for general adult medical examination with abnormal findings: Secondary | ICD-10-CM | POA: Diagnosis not present

## 2020-04-07 DIAGNOSIS — Z124 Encounter for screening for malignant neoplasm of cervix: Secondary | ICD-10-CM | POA: Diagnosis not present

## 2020-04-07 DIAGNOSIS — E039 Hypothyroidism, unspecified: Secondary | ICD-10-CM

## 2020-04-07 DIAGNOSIS — D509 Iron deficiency anemia, unspecified: Secondary | ICD-10-CM

## 2020-04-07 DIAGNOSIS — N938 Other specified abnormal uterine and vaginal bleeding: Secondary | ICD-10-CM

## 2020-04-07 DIAGNOSIS — Z0000 Encounter for general adult medical examination without abnormal findings: Secondary | ICD-10-CM

## 2020-04-07 DIAGNOSIS — F411 Generalized anxiety disorder: Secondary | ICD-10-CM

## 2020-04-07 DIAGNOSIS — D219 Benign neoplasm of connective and other soft tissue, unspecified: Secondary | ICD-10-CM

## 2020-04-07 DIAGNOSIS — Z1159 Encounter for screening for other viral diseases: Secondary | ICD-10-CM

## 2020-04-07 MED ORDER — NAPROXEN 500 MG PO TABS: 500.0000 mg | ORAL_TABLET | Freq: Two times a day (BID) | ORAL | 1 refills | Status: DC

## 2020-04-07 MED ORDER — SERTRALINE HCL 100 MG PO TABS: 100.0000 mg | ORAL_TABLET | Freq: Every day | ORAL | 2 refills | Status: DC

## 2020-04-07 MED FILL — NAPROXEN 500 MG TABLET: 500 | 15 days supply | Qty: 30 | Fill #0

## 2020-04-07 MED FILL — SERTRALINE HCL 100 MG TAB: 100 | 30 days supply | Qty: 30 | Fill #0

## 2020-04-07 NOTE — Assessment & Plan Note (Signed)
Dietary changes, increase physical activity

## 2020-04-07 NOTE — Patient Instructions (Signed)
Ultrasound to be set up We will call with lab results Take Naprosyn during menses cycle  zoloft increased to 100mg  once a day  F/U 3 months

## 2020-04-07 NOTE — Assessment & Plan Note (Signed)
Heavy menses , history of small fibroid, concern this has enlarged or she has more Obtain Ultrasound

## 2020-04-07 NOTE — Assessment & Plan Note (Signed)
Increase zoloft to 100mg  once a day

## 2020-04-07 NOTE — Progress Notes (Signed)
Subjective:    Patient ID: [name], female    DOB: 1971-08-12, 49 y.o.   MRN: [id number]  Patient presents for Gynecologic Exam (is not fasting)  Pt here for CPE and PAP Smear Medications reviewed Hypothyroidism- currently on levothyroxine  96mcg once a day   GAD- taking zoloft  50mg  , she can tell a difference but still feels some anxiety and on edge at times. She has been considering a higher dose to keep her mood stable  Denies feeling depressed SAD, sleep is good   Obesity with peropheral edema lasix   Chronic allergies/rhinitis- using singulair, nasal sprays   Discussed Hep C screening  Immunizations-  TDAP/FLU UTD /COVID-19 UTD   PAP SMEAR Due transformation zone absent last year, otherwise normal  Menses has been very heavy, passing large clots having increased pain during menses  History of fibroid in the past    Mammaogram UTD  [facility]lon cancer screening - she was unable to tolerate the prep, Dr. Juanita Craver  Iron def anemia- off iron tablets   Review Of Systems:  GEN- denies fatigue, fever, weight loss,weakness, recent illness HEENT- denies eye drainage, change in vision, nasal discharge, CVS- denies chest pain, palpitations RESP- denies SOB, cough, wheeze ABD- denies N/V, change in stools, abd pain GU- denies dysuria, hematuria, dribbling, incontinence MSK- denies joint pain, muscle aches, injury Neuro- denies headache, dizziness, syncope, seizure activity       Objective:    BP (!) 134/72   Pulse 94   Temp 98.1 F (36.7 C) (Temporal)   Resp 16   Ht 5\' 1"  (1.549 m)   Wt (!) 223 lb (101.2 kg)   SpO2 99%   BMI 42.14 kg/m  GEN- NAD, alert and oriented x3 HEENT- PERRL, EOMI, non injected sclera, pink conjunctiva, MMM, oropharynx clear Neck- Supple, no thyromegaly CVS- RRR, no murmur RESP-CTAB ABD-NABS,soft,NT,ND GU- normal external genitalia, vaginal mucosa pink and moist, cervix visualized no growth, no blood form os, minimal thin clear  discharge, no CMT, no ovarian masses, uterus normal size PSYCH- normal affect and mood, phq 9 SCORE 1 EXT- No edema Pulses- Radial, DP- 2+        Assessment & Plan:      Problem List Items Addressed This Visit      Unprioritized   Class 3 obesity    Dietary changes, increase physical activity       GAD (generalized anxiety disorder)    Increase zoloft to 100mg  once a day        Relevant Medications   sertraline (ZOLOFT) 100 MG tablet   Hypothyroidism    Check TSH continue replacement       Relevant Orders   T3, free   TSH   Iron deficiency anemia    Heavy menses , history of small fibroid, concern this has enlarged or she has more Obtain Ultrasound      Relevant Orders   Iron, TIBC and Ferritin Panel    Other Visit Diagnoses    Routine general medical examination at a health care facility    -  Primary   CPE done PAP Smear done   Relevant Orders   CBC with Differential/Platelet (Completed)   Comprehensive metabolic panel   Lipid panel   Cervical cancer screening       Relevant Orders   Pap IG w/ reflex to HPV when ASC-U   Dysfunctional uterine bleeding       NAPROSYN during cycles    Relevant  Orders   Iron, TIBC and Ferritin Panel   US Pelvic Complete With Transvaginal   Fibroid       Relevant Orders   US Pelvic Complete With Transvaginal   Need for hepatitis C screening test       Relevant Orders   Hepatitis C antibody      Note: This dictation was prepared with Dragon dictation along with smaller phrase technology. Any transcriptional errors that result from this process are unintentional.

## 2020-04-07 NOTE — Assessment & Plan Note (Signed)
Check TSH continue replacement

## 2020-04-08 ENCOUNTER — Encounter: Payer: Self-pay | Admitting: Family Medicine

## 2020-04-08 LAB — COMPREHENSIVE METABOLIC PANEL
AG Ratio: 1.2 (calc) (ref 1.0–2.5)
ALT: 12 U/L (ref 6–29)
AST: 15 U/L (ref 10–35)
Albumin: 3.9 g/dL (ref 3.6–5.1)
Alkaline phosphatase (APISO): 78 U/L (ref 31–125)
BUN: 13 mg/dL (ref 7–25)
CO2: 21 mmol/L (ref 20–32)
Calcium: 9.1 mg/dL (ref 8.6–10.2)
Chloride: 102 mmol/L (ref 98–110)
Creat: 0.89 mg/dL (ref 0.50–1.10)
Globulin: 3.3 g/dL (calc) (ref 1.9–3.7)
Glucose, Bld: 78 mg/dL (ref 65–99)
Potassium: 3.9 mmol/L (ref 3.5–5.3)
Sodium: 136 mmol/L (ref 135–146)
Total Bilirubin: 0.2 mg/dL (ref 0.2–1.2)
Total Protein: 7.2 g/dL (ref 6.1–8.1)

## 2020-04-08 LAB — CBC WITH DIFFERENTIAL/PLATELET
Absolute Monocytes: 789 cells/uL (ref 200–950)
Basophils Absolute: 88 cells/uL (ref 0–200)
Basophils Relative: 1.3 %
Eosinophils Absolute: 197 cells/uL (ref 15–500)
Eosinophils Relative: 2.9 %
HCT: 29.6 % — ABNORMAL LOW (ref 35.0–45.0)
Hemoglobin: 9.2 g/dL — ABNORMAL LOW (ref 11.7–15.5)
Lymphs Abs: 2448 cells/uL (ref 850–3900)
MCH: 23.7 pg — ABNORMAL LOW (ref 27.0–33.0)
MCHC: 31.1 g/dL — ABNORMAL LOW (ref 32.0–36.0)
MCV: 76.1 fL — ABNORMAL LOW (ref 80.0–100.0)
MPV: 10.4 fL (ref 7.5–12.5)
Monocytes Relative: 11.6 %
Neutro Abs: 3278 cells/uL (ref 1500–7800)
Neutrophils Relative %: 48.2 %
Platelets: 427 10*3/uL — ABNORMAL HIGH (ref 140–400)
RBC: 3.89 10*6/uL (ref 3.80–5.10)
RDW: 15.1 % — ABNORMAL HIGH (ref 11.0–15.0)
Total Lymphocyte: 36 %
WBC: 6.8 10*3/uL (ref 3.8–10.8)

## 2020-04-08 LAB — LIPID PANEL
Cholesterol: 168 mg/dL (ref <200)
HDL: 66 mg/dL (ref >=50)
LDL Cholesterol (Calc): 85 mg/dL (calc)
Non-HDL Cholesterol (Calc): 102 mg/dL (calc) (ref <130)
Total CHOL/HDL Ratio: 2.5 (calc) (ref <5.0)
Triglycerides: 81 mg/dL (ref <150)

## 2020-04-08 LAB — HEPATITIS C ANTIBODY
Hepatitis C Ab: NONREACTIVE
SIGNAL TO CUT-OFF: 0.01 (ref <1.00)

## 2020-04-08 LAB — IRON,TIBC AND FERRITIN PANEL
Ferritin: 5 ng/mL — ABNORMAL LOW (ref 16–232)
Iron: 12 ug/dL — ABNORMAL LOW (ref 40–190)

## 2020-04-08 LAB — TSH: TSH: 1.97 mIU/L

## 2020-04-08 LAB — T3, FREE: T3, Free: 2.8 pg/mL (ref 2.3–4.2)

## 2020-04-09 LAB — PAP IG W/ RFLX HPV ASCU

## 2020-04-14 ENCOUNTER — Other Ambulatory Visit: Payer: Self-pay | Admitting: Family Medicine

## 2020-04-15 ENCOUNTER — Other Ambulatory Visit: Payer: Self-pay | Admitting: Family Medicine

## 2020-04-15 MED FILL — FLUTICASONE PROP 50 MCG SPR: 50 | 30 days supply | Qty: 16 | Fill #0

## 2020-04-19 ENCOUNTER — Ambulatory Visit
Admission: RE | Admit: 2020-04-19 | Discharge: 2020-04-19 | Disposition: A | Discharge status: Home / Self Care | Payer: No Typology Code available for payment source | Source: Ambulatory Visit | Attending: Family Medicine | Admitting: Family Medicine

## 2020-04-19 DIAGNOSIS — D219 Benign neoplasm of connective and other soft tissue, unspecified: Secondary | ICD-10-CM

## 2020-04-19 DIAGNOSIS — N938 Other specified abnormal uterine and vaginal bleeding: Secondary | ICD-10-CM

## 2020-04-19 MED FILL — NAPROXEN 500 MG TABLET: 500 | 15 days supply | Qty: 30 | Fill #0

## 2020-04-19 MED FILL — SERTRALINE HCL 100 MG TAB: 100 | 30 days supply | Qty: 30 | Fill #0

## 2020-04-21 ENCOUNTER — Encounter: Payer: Self-pay | Admitting: Family Medicine

## 2020-06-09 MED FILL — SERTRALINE HCL 100 MG TAB: 100 | 30 days supply | Qty: 30 | Fill #1

## 2020-06-09 MED FILL — IPRATROPIUM 0.03% SPRAY: 0.03 | 86 days supply | Qty: 60 | Fill #1

## 2020-06-09 MED FILL — FLUTICASONE PROP 50 MCG SPR: 50 | 30 days supply | Qty: 16 | Fill #1

## 2020-06-09 MED FILL — ESOMEPRAZOLE MAG DR 40 MG C: 40 | 90 days supply | Qty: 90 | Fill #1

## 2020-07-07 ENCOUNTER — Other Ambulatory Visit: Payer: Self-pay

## 2020-07-07 ENCOUNTER — Encounter: Payer: Self-pay | Admitting: Family Medicine

## 2020-07-07 ENCOUNTER — Ambulatory Visit (INDEPENDENT_AMBULATORY_CARE_PROVIDER_SITE_OTHER): Payer: No Typology Code available for payment source | Admitting: Family Medicine

## 2020-07-07 VITALS — BP 140/80 | HR 87 | Temp 99.0°F | Ht 62.0 in | Wt 225.0 lb

## 2020-07-07 DIAGNOSIS — J309 Allergic rhinitis, unspecified: Secondary | ICD-10-CM

## 2020-07-07 DIAGNOSIS — D508 Other iron deficiency anemias: Secondary | ICD-10-CM

## 2020-07-07 DIAGNOSIS — F411 Generalized anxiety disorder: Secondary | ICD-10-CM | POA: Diagnosis not present

## 2020-07-07 DIAGNOSIS — D250 Submucous leiomyoma of uterus: Secondary | ICD-10-CM

## 2020-07-07 DIAGNOSIS — D251 Intramural leiomyoma of uterus: Secondary | ICD-10-CM

## 2020-07-07 DIAGNOSIS — Z23 Encounter for immunization: Secondary | ICD-10-CM

## 2020-07-07 DIAGNOSIS — K219 Gastro-esophageal reflux disease without esophagitis: Secondary | ICD-10-CM | POA: Diagnosis not present

## 2020-07-07 DIAGNOSIS — E669 Obesity, unspecified: Secondary | ICD-10-CM

## 2020-07-07 NOTE — Assessment & Plan Note (Signed)
Recheck iron stores Will plan to refer to hematology if still low There was a concern in the past with her GI and the amount of iron she was taking regarding liver deposits   For fibroids recommend she schedule with GYN

## 2020-07-07 NOTE — Assessment & Plan Note (Signed)
Add netty pot/nasal saline rinse Then use flonase Continue claritin, singulair

## 2020-07-07 NOTE — Assessment & Plan Note (Signed)
Continue nexium 

## 2020-07-07 NOTE — Assessment & Plan Note (Signed)
Doing well on current regimen 100mg  zoloft, no changes

## 2020-07-07 NOTE — Patient Instructions (Addendum)
Magnesium 250-400mg  At bedtime Flu shot  F/u 4 months

## 2020-07-07 NOTE — Progress Notes (Signed)
   Subjective:    Patient ID: [name], female    DOB: 1971-05-06, 49 y.o.   MRN: [id number]  Patient presents for Follow-up (x46mos)  Patient here to follow-up chronic medical problems.  Medications reviewed. Hypothyroidism she is currently on levothyroxine 25 mcg once a day  GAD- taking zoloft increased to 100mg  once a day Doing better on this does, feels aniety is controlled    Iron Def anemia - restarted on iron therapy with vitamin C, she has history of heavy menses, due for recheck ,recommended she see GYN but she never received that message   She took for 2 months   She wanted to ask about magnesium use , she want to try to use it for overall health and also help her rest at nighttime.  She was not sure of the dosage.   She has chronic rhinitis, nasal congestion, using flonase, has seen ENT  Flu shot   Review Of Systems:  GEN- denies fatigue, fever, weight loss,weakness, recent illness HEENT- denies eye drainage, change in vision, +nasal discharge, CVS- denies chest pain, palpitations RESP- denies SOB, cough, wheeze ABD- denies N/V, change in stools, abd pain GU- denies dysuria, hematuria, dribbling, incontinence MSK- denies joint pain, muscle aches, injury Neuro- denies headache, dizziness, syncope, seizure activity       Objective:    BP 140/80 (BP Location: Right Arm, Patient Position: Sitting, Cuff Size: Large)   Pulse 87   Temp 99 F (37.2 C) (Oral)   Ht 5\' 2"  (1.575 m)   Wt 225 lb (102.1 kg)   SpO2 98%   BMI 41.15 kg/m  . GEN- NAD, alert and oriented x3 HEENT- PERRL, EOMI, non injected sclera, pink conjunctiva, MMM, oropharynx mild injection, TM clear bilat no effusion,  No  maxillary sinus tenderness, inflammed turbinates,  Nasal drainage  Neck- Supple, no LAD CVS- RRR, no murmur RESP-CTAB EXT- No edema Pulses- Radial 2+          Assessment & Plan:      Problem List Items Addressed This Visit      Unprioritized   Chronic allergic  rhinitis    Add netty pot/nasal saline rinse Then use flonase Continue claritin, singulair        Class 3 obesity   Relevant Orders   Comprehensive metabolic panel   GAD (generalized anxiety disorder) - Primary    Doing well on current regimen 100mg  zoloft, no changes       GERD (gastroesophageal reflux disease)    Continue nexium      Iron deficiency anemia    Recheck iron stores Will plan to refer to hematology if still low There was a concern in the past with her GI and the amount of iron she was taking regarding liver deposits   For fibroids recommend she schedule with GYN      Relevant Orders   CBC with Differential/Platelet   Comprehensive metabolic panel   Iron, TIBC and Ferritin Panel    Other Visit Diagnoses    Intramural and submucous leiomyoma of uterus       Need for immunization against influenza       Relevant Orders   Flu Vaccine QUAD 36+ mos IM (Completed)      Note: This dictation was prepared with Dragon dictation along with smaller phrase technology. Any transcriptional errors that result from this process are unintentional.

## 2020-07-08 LAB — CBC WITH DIFFERENTIAL/PLATELET
Absolute Monocytes: 824 cells/uL (ref 200–950)
Basophils Absolute: 77 cells/uL (ref 0–200)
Basophils Relative: 1 %
Eosinophils Absolute: 231 cells/uL (ref 15–500)
Eosinophils Relative: 3 %
HCT: 31.2 % — ABNORMAL LOW (ref 35.0–45.0)
Hemoglobin: 9.2 g/dL — ABNORMAL LOW (ref 11.7–15.5)
Lymphs Abs: 2426 cells/uL (ref 850–3900)
MCH: 21.1 pg — ABNORMAL LOW (ref 27.0–33.0)
MCHC: 29.5 g/dL — ABNORMAL LOW (ref 32.0–36.0)
MCV: 71.7 fL — ABNORMAL LOW (ref 80.0–100.0)
MPV: 10.5 fL (ref 7.5–12.5)
Monocytes Relative: 10.7 %
Neutro Abs: 4143 cells/uL (ref 1500–7800)
Neutrophils Relative %: 53.8 %
Platelets: 404 10*3/uL — ABNORMAL HIGH (ref 140–400)
RBC: 4.35 10*6/uL (ref 3.80–5.10)
RDW: 17.9 % — ABNORMAL HIGH (ref 11.0–15.0)
Total Lymphocyte: 31.5 %
WBC: 7.7 10*3/uL (ref 3.8–10.8)

## 2020-07-08 LAB — IRON,TIBC AND FERRITIN PANEL
%SAT: 5 % (calc) — ABNORMAL LOW (ref 16–45)
Ferritin: 4 ng/mL — ABNORMAL LOW (ref 16–232)
Iron: 22 ug/dL — ABNORMAL LOW (ref 40–190)
TIBC: 450 mcg/dL (calc) (ref 250–450)

## 2020-07-08 LAB — COMPREHENSIVE METABOLIC PANEL
AG Ratio: 1.2 (calc) (ref 1.0–2.5)
ALT: 11 U/L (ref 6–29)
AST: 14 U/L (ref 10–35)
Albumin: 3.8 g/dL (ref 3.6–5.1)
Alkaline phosphatase (APISO): 68 U/L (ref 31–125)
BUN: 12 mg/dL (ref 7–25)
CO2: 23 mmol/L (ref 20–32)
Calcium: 9 mg/dL (ref 8.6–10.2)
Chloride: 106 mmol/L (ref 98–110)
Creat: 0.83 mg/dL (ref 0.50–1.10)
Globulin: 3.2 g/dL (calc) (ref 1.9–3.7)
Glucose, Bld: 75 mg/dL (ref 65–99)
Potassium: 4.3 mmol/L (ref 3.5–5.3)
Sodium: 137 mmol/L (ref 135–146)
Total Bilirubin: 0.3 mg/dL (ref 0.2–1.2)
Total Protein: 7 g/dL (ref 6.1–8.1)

## 2020-07-09 ENCOUNTER — Encounter: Payer: Self-pay | Admitting: Family Medicine

## 2020-07-09 NOTE — Addendum Note (Signed)
Addended by: Vic Blackbird F on: 07/09/2020 01:38 PM   Modules accepted: Orders

## 2020-07-19 MED FILL — LEVOTHYROXINE SODIUM 25 MCG: 25 | 90 days supply | Qty: 90 | Fill #1

## 2020-07-19 MED FILL — FLUTICASONE PROP 50 MCG SPR: 50 | 30 days supply | Qty: 16 | Fill #2

## 2020-07-19 MED FILL — SERTRALINE HCL 100 MG TAB: 100 | 30 days supply | Qty: 30 | Fill #2

## 2020-08-09 ENCOUNTER — Ambulatory Visit (HOSPITAL_COMMUNITY): Payer: No Typology Code available for payment source | Admitting: Hematology

## 2020-08-10 ENCOUNTER — Encounter: Payer: Self-pay | Admitting: Family Medicine

## 2020-08-18 ENCOUNTER — Other Ambulatory Visit (HOSPITAL_COMMUNITY): Payer: Self-pay | Admitting: *Deleted

## 2020-08-18 ENCOUNTER — Inpatient Hospital Stay (HOSPITAL_COMMUNITY)
Payer: No Typology Code available for payment source | Attending: Hematology and Oncology | Admitting: Hematology and Oncology

## 2020-08-18 ENCOUNTER — Other Ambulatory Visit: Payer: Self-pay

## 2020-08-18 DIAGNOSIS — D508 Other iron deficiency anemias: Secondary | ICD-10-CM

## 2020-08-18 DIAGNOSIS — D509 Iron deficiency anemia, unspecified: Secondary | ICD-10-CM | POA: Diagnosis present

## 2020-08-18 DIAGNOSIS — Z803 Family history of malignant neoplasm of breast: Secondary | ICD-10-CM | POA: Diagnosis not present

## 2020-08-18 NOTE — Patient Instructions (Signed)
[facility] Discharge Instructions  You were seen today by Dr. Gudena.    Thank you for choosing [facility] at [facility] to provide your oncology and hematology care.  To afford each patient quality time with our provider, please arrive at least 15 minutes before your scheduled appointment time.   If you have a lab appointment with the Cancer Center please come in thru the Main Entrance and check in at the main information desk.  You need to re-schedule your appointment should you arrive 10 or more minutes late.  We strive to give you quality time with our providers, and arriving late affects you and other patients whose appointments are after yours.  Also, if you no show three or more times for appointments you may be dismissed from the clinic at the providers discretion.     Again, thank you for choosing [facility].  Our hope is that these requests will decrease the amount of time that you wait before being seen by our physicians.       _____________________________________________________________  Should you have questions after your visit to [facility], please contact our office at [phone] and follow the prompts.  Our office hours are 8:00 a.m. and 4:30 p.m. Monday - Friday.  Please note that voicemails left after 4:00 p.m. may not be returned until the following business day.  We are closed weekends and major holidays.  You do have access to a nurse 24-7, just call the main number to the clinic [phone] and do not press any options, hold on the line and a nurse will answer the phone.    For prescription refill requests, have your pharmacy contact our office and allow 72 hours.    Due to Covid, you will need to wear a mask upon entering the hospital. If you do not have a mask, a mask will be given to you at the Main Entrance upon arrival. For doctor visits, patients may have 1 support person age  18 or older with them. For treatment visits, patients can not have anyone with them due to social distancing guidelines and our immunocompromised population.      

## 2020-08-18 NOTE — Progress Notes (Signed)
[place] CONSULT NOTE  Patient Care Team: [place], Modena Nunnery, MD as PCP - General (Family Medicine)  CHIEF COMPLAINTS/PURPOSE OF CONSULTATION:  Recurrent iron deficiency anemia  HISTORY OF PRESENTING ILLNESS:  [name] 49 y.o. female is here because of recurrent iron deficiency anemia.  Patient was found to be iron deficient almost a year ago and the this is because of heavy menstrual cycles.  She has been on oral iron therapy intermittently over the past year.  She is currently still taking oral iron tablets.  In October when she had her iron studies she was found to have a ferritin of 5.  She was instructed to see [place] for discussion regarding intravenous iron therapy.  She has seen her gynecologist who feels that her heavy menstrual cycles are related to uterine fibroid.  There is a plan to perform a procedure to stop the bleeding from the fibroid.  She takes antacids but eats a regular diet.  I reviewed her records extensively and collaborated the history with the patient.    MEDICAL HISTORY:  Past Medical History:  Diagnosis Date  . Acid reflux   . Anxiety   . Panic attack   . Thyroid disease   . Tinnitus     SURGICAL HISTORY: Past Surgical History:  Procedure Laterality Date  . BREAST BIOPSY      SOCIAL HISTORY: Social History   Socioeconomic History  . Marital status: Married    Spouse name: Not on file  . Number of children: 0  . Years of education: Not on file  . Highest education level: Not on file  Occupational History    Employer: [employer]  Tobacco Use  . Smoking status: Never Smoker  . Smokeless tobacco: Never Used  Vaping Use  . Vaping Use: Never used  Substance and Sexual Activity  . Alcohol use: Yes    Comment: occ  . Drug use: No  . Sexual activity: Yes  Other Topics Concern  . Not on file  Social History Narrative  . Not on file   Social Determinants of Health   Financial Resource Strain: Low Risk   . Difficulty of  Paying Living Expenses: Not hard at all  Food Insecurity: No Food Insecurity  . Worried About Charity fundraiser in the Last Year: Never true  . Ran Out of Food in the Last Year: Never true  Transportation Needs: No Transportation Needs  . Lack of Transportation (Medical): No  . Lack of Transportation (Non-Medical): No  Physical Activity: Sufficiently Active  . Days of Exercise per Week: 3 days  . Minutes of Exercise per Session: 120 min  Stress: No Stress Concern Present  . Feeling of Stress : Only a little  Social Connections: Socially Integrated  . Frequency of Communication with Friends and Family: More than three times a week  . Frequency of Social Gatherings with Friends and Family: Twice a week  . Attends Religious Services: More than 4 times per year  . Active Member of Clubs or Organizations: Yes  . Attends Archivist Meetings: More than 4 times per year  . Marital Status: Married  Human resources officer Violence: Not At Risk  . Fear of Current or Ex-Partner: No  . Emotionally Abused: No  . Physically Abused: No  . Sexually Abused: No    FAMILY HISTORY: Family History  Problem Relation Age of Onset  . Arthritis Mother   . Hypertension Mother   . Hyperlipidemia Father   .  Hypertension Father   . Cancer Maternal Aunt        breast  . Breast cancer Maternal Aunt 13  . Cancer Paternal Aunt        breast  . Breast cancer Paternal Aunt 59  . Arthritis Maternal Grandmother   . Stroke Maternal Grandfather   . Diabetes Paternal Grandmother   . Heart disease Paternal Grandfather   . Breast cancer Paternal Aunt 34    ALLERGIES:  has No Known Allergies.  MEDICATIONS:  Current Outpatient Medications  Medication Sig Dispense Refill  . Cholecalciferol (VITAMIN D3) 50 MCG (2000 UT) CHEW Chew 2 capsules by mouth daily.    Marland Kitchen esomeprazole (NEXIUM) 40 MG capsule Take 1 capsule (40 mg total) by mouth daily. 90 capsule 2  . fluticasone (FLONASE) 50 MCG/ACT nasal spray USE  2 SPRAYS IN EACH NOSTRIL DAILY 16 g 3  . ipratropium (ATROVENT) 0.03 % nasal spray USE 2 SPRAYS IN EACH NOSTRIL EVERY 12 HOURS 60 mL 10  . Lactobacillus Rhamnosus, GG, (CULTURELLE PO) Take 1 tablet by mouth daily.    Marland Kitchen levothyroxine (SYNTHROID) 25 MCG tablet Take 1 tablet (25 mcg total) by mouth daily. 90 tablet 1  . loratadine (CLARITIN) 10 MG tablet Take 10 mg by mouth daily.    . montelukast (SINGULAIR) 10 MG tablet Take 1 tablet (10 mg total) by mouth at bedtime. 90 tablet 2  . Multiple Vitamins-Iron (MULTIVITAMINS WITH IRON) TABS tablet Take 1 tablet by mouth daily.  0  . naproxen (NAPROSYN) 500 MG tablet Take 1 tablet (500 mg total) by mouth 2 (two) times daily with a meal. During menses 30 tablet 1  . sertraline (ZOLOFT) 100 MG tablet Take 1 tablet (100 mg total) by mouth daily. 30 tablet 2  . Simethicone (GAS-X EXTRA STRENGTH) 125 MG CAPS Take 2 capsules by mouth daily as needed.    . vitamin B-12 (CYANOCOBALAMIN) 1000 MCG tablet Take 1,000 mcg by mouth 2 (two) times daily.     No current facility-administered medications for this visit.    REVIEW OF SYSTEMS:   Constitutional: Denies fevers, chills or abnormal night sweats Eyes: Denies blurriness of vision, double vision or watery eyes Ears, nose, mouth, throat, and face: Denies mucositis or sore throat Respiratory: Denies cough, dyspnea or wheezes Cardiovascular: Denies palpitation, chest discomfort or lower extremity swelling Gastrointestinal:  Denies nausea, heartburn or change in bowel habits Skin: Denies abnormal skin rashes Lymphatics: Denies new lymphadenopathy or easy bruising Neurological:Denies numbness, tingling or new weaknesses Behavioral/Psych: Mood is stable, no new changes    All other systems were reviewed with the patient and are negative.  PHYSICAL EXAMINATION: ECOG PERFORMANCE STATUS: 1 - Symptomatic but completely ambulatory  Vitals:   08/18/20 1015  BP: (!) 179/90  Pulse: 88  Resp: 18  Temp: 97.6 F  (36.4 C)  SpO2: 100%   Filed Weights   08/18/20 1015  Weight: 226 lb 3.2 oz (102.6 kg)    GENERAL:alert, no distress and comfortable SKIN: skin color, texture, turgor are normal, no rashes or significant lesions EYES: normal, conjunctiva are pink and non-injected, sclera clear OROPHARYNX:no exudate, no erythema and lips, buccal mucosa, and tongue normal  NECK: supple, thyroid normal size, non-tender, without nodularity LYMPH:  no palpable lymphadenopathy in the cervical, axillary or inguinal LUNGS: clear to auscultation and percussion with normal breathing effort HEART: regular rate & rhythm and no murmurs and no lower extremity edema ABDOMEN:abdomen soft, non-tender and normal bowel sounds Musculoskeletal:no cyanosis of digits and  no clubbing  PSYCH: alert & oriented x 3 with fluent speech NEURO: no focal motor/sensory deficits    LABORATORY DATA:  I have reviewed the data as listed Lab Results  Component Value Date   WBC 7.7 07/07/2020   HGB 9.2 (L) 07/07/2020   HCT 31.2 (L) 07/07/2020   MCV 71.7 (L) 07/07/2020   PLT 404 (H) 07/07/2020   Lab Results  Component Value Date   NA 137 07/07/2020   K 4.3 07/07/2020   CL 106 07/07/2020   CO2 23 07/07/2020    RADIOGRAPHIC STUDIES: I have personally reviewed the radiological reports and agreed with the findings in the report.  ASSESSMENT AND PLAN:  Iron deficiency anemia Lab review: 07/07/20: Ferritin: 4, Iron Sat: 5%, Hb 9.2, MCV: 71.7, Pl 404 Anemia started atleast Iron deficiency since Oct 2020    Ref. Range 06/30/2019 16:09 09/30/2019 09:54 04/07/2020 12:48 07/07/2020 09:00  Iron Latest Ref Range: 40 - 190 mcg/dL 28 (L) 186 12 (L) 22 (L)  TIBC Latest Ref Range: 250 - 450 mcg/dL (calc) 377 374 CANCELED 450  %SAT Latest Ref Range: 16 - 45 % (calc) 7 (L) 50 (H)  5 (L)  Ferritin Latest Ref Range: 16 - 232 ng/mL 8 (L) 38 5 (L) 4 (L)    Differential: Blood loss vs Malabsorption Patient has had severe menstrual bleeds  that last for up to 10 days. GYN evaluation identified uterine fibroids which may be the cause of the heavy bleeding. She also sees Dr. Collene Mares with gastroenterology.  Counseling: I discussed with the patient the process of iron absorption. I counseled extensively regarding the different causes of iron deficiency including blood loss and malabsorption.    Recommendation: proceed with IV iron infusion: I recommended Venofer 300 mg x 3 doses Discussed risks and benefits of IV iron infusion and she is agreeable and willing to proceed.   I discussed with the patient that potentially there may be a need for additional IV iron infusions if the iron levels were to remain low in the future. The frequency of need of IV iron would depend on many other factors including the rate of loss and by the degree of absorption.  Return to clinic in 4 months with recheck on iron studies and hemoglobin.  All questions were answered. The patient knows to call the clinic with any problems, questions or concerns.    Harriette Ohara, MD 08/18/20

## 2020-08-18 NOTE — Assessment & Plan Note (Addendum)
Lab review: 07/07/20: Ferritin: 4, Iron Sat: 5%, Hb 9.2, MCV: 71.7, Pl 404 Anemia started atleast Iron deficiency since Oct 2020    Ref. Range 06/30/2019 16:09 09/30/2019 09:54 04/07/2020 12:48 07/07/2020 09:00  Iron Latest Ref Range: 40 - 190 mcg/dL 28 (L) 186 12 (L) 22 (L)  TIBC Latest Ref Range: 250 - 450 mcg/dL (calc) 377 374 CANCELED 450  %SAT Latest Ref Range: 16 - 45 % (calc) 7 (L) 50 (H)  5 (L)  Ferritin Latest Ref Range: 16 - 232 ng/mL 8 (L) 38 5 (L) 4 (L)    Differential: Blood loss vs Malabsorption Counseling: I discussed with the patient the process of iron absorption. I counseled extensively regarding the different causes of iron deficiency including blood loss and malabsorption. Patient has had upper endoscopies and colonoscopies and did not have any clear identified source of blood loss.  Recommendation: 1. proceed with IV iron infusion  2. GI evaluation  I discussed with the patient that potentially there may be a need for additional IV iron infusions if the iron levels were to remain low in the future. The frequency of need of IV iron would depend on many other factors including the rate of loss and by the degree of absorption.  Return to clinic in 3 months with recheck on iron studies and hemoglobin.

## 2020-08-24 ENCOUNTER — Other Ambulatory Visit: Payer: Self-pay

## 2020-08-24 ENCOUNTER — Inpatient Hospital Stay (HOSPITAL_COMMUNITY): Payer: No Typology Code available for payment source

## 2020-08-24 VITALS — BP 167/90 | HR 78 | Temp 97.3°F | Resp 18

## 2020-08-24 DIAGNOSIS — D509 Iron deficiency anemia, unspecified: Secondary | ICD-10-CM | POA: Diagnosis not present

## 2020-08-24 DIAGNOSIS — Z1211 Encounter for screening for malignant neoplasm of colon: Secondary | ICD-10-CM | POA: Insufficient documentation

## 2020-08-24 DIAGNOSIS — K760 Fatty (change of) liver, not elsewhere classified: Secondary | ICD-10-CM | POA: Insufficient documentation

## 2020-08-24 DIAGNOSIS — D508 Other iron deficiency anemias: Secondary | ICD-10-CM

## 2020-08-24 MED ORDER — SODIUM CHLORIDE 0.9 % IV SOLN: Freq: Once | INTRAVENOUS | Status: AC

## 2020-08-24 MED ORDER — SODIUM CHLORIDE 0.9 % IV SOLN
300.0000 mg | Freq: Once | INTRAVENOUS | Status: AC
  Administered 2020-08-24: 300 mg via INTRAVENOUS
  Filled 2020-08-24: qty 15

## 2020-08-24 NOTE — Progress Notes (Signed)
[name] presents today for IV iron infusion. Infusion tolerated without incident or complaint. See MAR for details. VSS prior to and post infusion. Patient observed for 30 minutes post infusion and was discharged in satisfactory condition with follow up instructions.

## 2020-08-24 NOTE — Patient Instructions (Signed)
[facility]  Discharge Instructions:   _______________________________________________________________  Thank you for choosing [facility] at [facility] to provide your oncology and hematology care.  To afford each patient quality time with our providers, please arrive at least 15 minutes before your scheduled appointment.  You need to re-schedule your appointment if you arrive 10 or more minutes late.  We strive to give you quality time with our providers, and arriving late affects you and other patients whose appointments are after yours.  Also, if you no show three or more times for appointments you may be dismissed from the clinic.  Again, thank you for choosing [facility]. Our hope is that these requests will allow you access to exceptional care and in a timely manner. _______________________________________________________________  If you have questions after your visit, please contact our office at [phone] between the hours of 8:30 a.m. and 5:00 p.m. Voicemails left after 4:30 p.m. will not be returned until the following business day. _______________________________________________________________  For prescription refill requests, have your pharmacy contact our office. _______________________________________________________________  Recommendations made by the consultant and any test results will be sent to your referring physician. _______________________________________________________________ 

## 2020-08-27 ENCOUNTER — Ambulatory Visit (HOSPITAL_COMMUNITY): Payer: No Typology Code available for payment source

## 2020-08-28 ENCOUNTER — Ambulatory Visit: Payer: No Typology Code available for payment source

## 2020-08-30 ENCOUNTER — Ambulatory Visit (HOSPITAL_COMMUNITY): Payer: No Typology Code available for payment source

## 2020-08-30 MED FILL — FLUTICASONE PROP 50 MCG SPR: 50 | 30 days supply | Qty: 16 | Fill #3

## 2020-08-31 ENCOUNTER — Other Ambulatory Visit: Payer: Self-pay

## 2020-08-31 ENCOUNTER — Inpatient Hospital Stay (HOSPITAL_COMMUNITY): Payer: No Typology Code available for payment source

## 2020-08-31 VITALS — BP 144/76 | HR 77 | Temp 97.6°F | Resp 18

## 2020-08-31 DIAGNOSIS — J309 Allergic rhinitis, unspecified: Secondary | ICD-10-CM

## 2020-08-31 DIAGNOSIS — D509 Iron deficiency anemia, unspecified: Secondary | ICD-10-CM | POA: Diagnosis not present

## 2020-08-31 DIAGNOSIS — D508 Other iron deficiency anemias: Secondary | ICD-10-CM

## 2020-08-31 MED ORDER — FLUTICASONE PROPIONATE 50 MCG/ACT NA SUSP: 2.0000 | Freq: Every day | NASAL | 3 refills | Status: DC

## 2020-08-31 MED ORDER — SODIUM CHLORIDE 0.9 % IV SOLN: Freq: Once | INTRAVENOUS | Status: AC

## 2020-08-31 MED ORDER — SODIUM CHLORIDE 0.9 % IV SOLN
300.0000 mg | Freq: Once | INTRAVENOUS | Status: AC
  Administered 2020-08-31: 300 mg via INTRAVENOUS
  Filled 2020-08-31: qty 15

## 2020-08-31 NOTE — Progress Notes (Signed)
[name] presents today for IV iron infusion. Last infusion received in December, 2021 without difficulties. Infusion tolerated without incident or complaint. See MAR for details. VSS prior to and post infusion. Patient observed for 30 minutes post infusion and was discharged in satisfactory condition with follow up instructions.

## 2020-08-31 NOTE — Patient Instructions (Signed)
[facility]  Discharge Instructions:  Iron Sucrose injection What is this medicine? IRON SUCROSE (AHY ern SOO krohs) is an iron complex. Iron is used to make healthy red blood cells, which carry oxygen and nutrients throughout the body. This medicine is used to treat iron deficiency anemia in people with chronic kidney disease. This medicine may be used for other purposes; ask your health care provider or pharmacist if you have questions. COMMON BRAND NAME(S): Venofer What should I tell my health care provider before I take this medicine? They need to know if you have any of these conditions:  anemia not caused by low iron levels  heart disease  high levels of iron in the blood  kidney disease  liver disease  an unusual or allergic reaction to iron, other medicines, foods, dyes, or preservatives  pregnant or trying to get pregnant  breast-feeding How should I use this medicine? This medicine is for infusion into a vein. It is given by a health care professional in a hospital or clinic setting. Talk to your pediatrician regarding the use of this medicine in children. While this drug may be prescribed for children as young as 2 years for selected conditions, precautions do apply. Overdosage: If you think you have taken too much of this medicine contact a poison control center or emergency room at once. NOTE: This medicine is only for you. Do not share this medicine with others. What if I miss a dose? It is important not to miss your dose. Call your doctor or health care professional if you are unable to keep an appointment. What may interact with this medicine? Do not take this medicine with any of the following medications:  deferoxamine  dimercaprol  other iron products This medicine may also interact with the following medications:  chloramphenicol  deferasirox This list may not describe all possible interactions. Give your health  care provider a list of all the medicines, herbs, non-prescription drugs, or dietary supplements you use. Also tell them if you smoke, drink alcohol, or use illegal drugs. Some items may interact with your medicine. What should I watch for while using this medicine? Visit your doctor or healthcare professional regularly. Tell your doctor or healthcare professional if your symptoms do not start to get better or if they get worse. You may need blood work done while you are taking this medicine. You may need to follow a special diet. Talk to your doctor. Foods that contain iron include: whole grains/cereals, dried fruits, beans, or peas, leafy green vegetables, and organ meats (liver, kidney). What side effects may I notice from receiving this medicine? Side effects that you should report to your doctor or health care professional as soon as possible:  allergic reactions like skin rash, itching or hives, swelling of the face, lips, or tongue  breathing problems  changes in blood pressure  cough  fast, irregular heartbeat  feeling faint or lightheaded, falls  fever or chills  flushing, sweating, or hot feelings  joint or muscle aches/pains  seizures  swelling of the ankles or feet  unusually weak or tired Side effects that usually do not require medical attention (report to your doctor or health care professional if they continue or are bothersome):  diarrhea  feeling achy  headache  irritation at site where injected  nausea, vomiting  stomach upset  tiredness This list may not describe all possible side effects. Call your doctor for medical advice about side effects. You   may report side effects to FDA at 1-800-FDA-1088. Where should I keep my medicine? This drug is given in a hospital or clinic and will not be stored at home. NOTE: This sheet is a summary. It may not cover all possible information. If you have questions about this medicine, talk to your doctor,  pharmacist, or health care provider.  2020 Elsevier/Gold Standard (2011-06-08 17:14:35)  _______________________________________________________________  Thank you for choosing [facility] to provide your oncology and hematology care.  To afford each patient quality time with our providers, please arrive at least 15 minutes before your scheduled appointment.  You need to re-schedule your appointment if you arrive 10 or more minutes late.  We strive to give you quality time with our providers, and arriving late affects you and other patients whose appointments are after yours.  Also, if you no show three or more times for appointments you may be dismissed from the clinic.  Again, thank you for choosing [facility]. Our hope is that these requests will allow you access to exceptional care and in a timely manner. _______________________________________________________________  If you have questions after your visit, please contact our office at [phone] between the hours of 8:30 a.m. and 5:00 p.m. Voicemails left after 4:30 p.m. will not be returned until the following business day. _______________________________________________________________  For prescription refill requests, have your pharmacy contact our office. _______________________________________________________________  Recommendations made by the consultant and any test results will be sent to your referring physician. _______________________________________________________________ 

## 2020-09-07 ENCOUNTER — Inpatient Hospital Stay (HOSPITAL_COMMUNITY): Payer: No Typology Code available for payment source

## 2020-09-07 ENCOUNTER — Encounter (HOSPITAL_COMMUNITY): Payer: Self-pay

## 2020-09-07 ENCOUNTER — Other Ambulatory Visit: Payer: Self-pay

## 2020-09-07 VITALS — BP 130/73 | HR 74 | Temp 97.6°F | Resp 18

## 2020-09-07 DIAGNOSIS — D509 Iron deficiency anemia, unspecified: Secondary | ICD-10-CM | POA: Diagnosis not present

## 2020-09-07 DIAGNOSIS — D508 Other iron deficiency anemias: Secondary | ICD-10-CM

## 2020-09-07 MED ORDER — SODIUM CHLORIDE 0.9 % IV SOLN
300.0000 mg | Freq: Once | INTRAVENOUS | Status: AC
  Administered 2020-09-07: 300 mg via INTRAVENOUS
  Filled 2020-09-07: qty 15

## 2020-09-07 MED ORDER — SODIUM CHLORIDE 0.9 % IV SOLN: Freq: Once | INTRAVENOUS | Status: AC

## 2020-09-07 NOTE — Progress Notes (Signed)
Patient tolerated iron infusion with no complaints voiced.  Peripheral IV site clean and dry with good blood return noted before and after infusion.  Band aid applied.  VSS with discharge and left in satisfactory condition with no s/s of distress noted.   

## 2020-10-05 ENCOUNTER — Other Ambulatory Visit: Payer: Self-pay | Admitting: Family Medicine

## 2020-10-05 MED FILL — MONTELUKAST SOD 10 MG TAB: 10 | 90 days supply | Qty: 90 | Fill #1

## 2020-10-05 MED FILL — SERTRALINE HCL 100 MG TAB: 100 | 30 days supply | Qty: 30 | Fill #0

## 2020-10-18 MED FILL — FLUTICASONE PROP 50 MCG SPR: 50 | 90 days supply | Qty: 48 | Fill #0

## 2020-11-16 ENCOUNTER — Other Ambulatory Visit: Payer: Self-pay | Admitting: Obstetrics and Gynecology

## 2020-11-30 ENCOUNTER — Other Ambulatory Visit: Payer: Self-pay | Admitting: Family Medicine

## 2020-11-30 MED FILL — LEVOTHYROXINE SODIUM 25 MCG: 25 | 90 days supply | Qty: 90 | Fill #0

## 2020-12-02 ENCOUNTER — Other Ambulatory Visit (HOSPITAL_COMMUNITY): Payer: Self-pay | Admitting: Obstetrics and Gynecology

## 2020-12-03 MED FILL — miSOPROStol 200 MCG TABS: 200 | 1 days supply | Qty: 1 | Fill #0

## 2020-12-27 ENCOUNTER — Inpatient Hospital Stay (HOSPITAL_COMMUNITY): Payer: No Typology Code available for payment source | Attending: Hematology

## 2020-12-27 ENCOUNTER — Other Ambulatory Visit: Payer: Self-pay

## 2020-12-27 DIAGNOSIS — D500 Iron deficiency anemia secondary to blood loss (chronic): Secondary | ICD-10-CM | POA: Diagnosis present

## 2020-12-27 DIAGNOSIS — K760 Fatty (change of) liver, not elsewhere classified: Secondary | ICD-10-CM | POA: Diagnosis not present

## 2020-12-27 DIAGNOSIS — N920 Excessive and frequent menstruation with regular cycle: Secondary | ICD-10-CM | POA: Insufficient documentation

## 2020-12-27 DIAGNOSIS — D259 Leiomyoma of uterus, unspecified: Secondary | ICD-10-CM | POA: Diagnosis not present

## 2020-12-27 DIAGNOSIS — D508 Other iron deficiency anemias: Secondary | ICD-10-CM

## 2020-12-27 LAB — CBC WITH DIFFERENTIAL/PLATELET
Abs Immature Granulocytes: 0.03 10*3/uL (ref 0.00–0.07)
Basophils Absolute: 0.1 10*3/uL (ref 0.0–0.1)
Basophils Relative: 1 %
Eosinophils Absolute: 0.2 10*3/uL (ref 0.0–0.5)
Eosinophils Relative: 3 %
HCT: 38.9 % (ref 36.0–46.0)
Hemoglobin: 12.6 g/dL (ref 12.0–15.0)
Immature Granulocytes: 0 %
Lymphocytes Relative: 35 %
Lymphs Abs: 2.8 10*3/uL (ref 0.7–4.0)
MCH: 29.4 pg (ref 26.0–34.0)
MCHC: 32.4 g/dL (ref 30.0–36.0)
MCV: 90.7 fL (ref 80.0–100.0)
Monocytes Absolute: 0.9 10*3/uL (ref 0.1–1.0)
Monocytes Relative: 11 %
Neutro Abs: 4 10*3/uL (ref 1.7–7.7)
Neutrophils Relative %: 50 %
Platelets: 320 10*3/uL (ref 150–400)
RBC: 4.29 MIL/uL (ref 3.87–5.11)
RDW: 13.2 % (ref 11.5–15.5)
WBC: 8.1 10*3/uL (ref 4.0–10.5)
nRBC: 0 % (ref 0.0–0.2)

## 2020-12-27 LAB — IRON AND TIBC
Iron: 40 ug/dL (ref 28–170)
Saturation Ratios: 9 % — ABNORMAL LOW (ref 10.4–31.8)
TIBC: 448 ug/dL (ref 250–450)
UIBC: 408 ug/dL

## 2020-12-27 LAB — COMPREHENSIVE METABOLIC PANEL
ALT: 20 U/L (ref 0–44)
AST: 16 U/L (ref 15–41)
Albumin: 4 g/dL (ref 3.5–5.0)
Alkaline Phosphatase: 66 U/L (ref 38–126)
Anion gap: 7 (ref 5–15)
BUN: 12 mg/dL (ref 6–20)
CO2: 25 mmol/L (ref 22–32)
Calcium: 9.2 mg/dL (ref 8.9–10.3)
Chloride: 104 mmol/L (ref 98–111)
Creatinine, Ser: 0.79 mg/dL (ref 0.44–1.00)
GFR, Estimated: >60 mL/min (ref >60)
Glucose, Bld: 84 mg/dL (ref 70–99)
Potassium: 3.9 mmol/L (ref 3.5–5.1)
Sodium: 136 mmol/L (ref 135–145)
Total Bilirubin: 0.3 mg/dL (ref 0.3–1.2)
Total Protein: 7.7 g/dL (ref 6.5–8.1)

## 2020-12-27 LAB — FERRITIN: Ferritin: 11 ng/mL (ref 11–307)

## 2021-01-03 ENCOUNTER — Other Ambulatory Visit (HOSPITAL_COMMUNITY): Payer: Self-pay

## 2021-01-03 ENCOUNTER — Inpatient Hospital Stay (HOSPITAL_BASED_OUTPATIENT_CLINIC_OR_DEPARTMENT_OTHER): Payer: No Typology Code available for payment source | Admitting: Hematology

## 2021-01-03 ENCOUNTER — Other Ambulatory Visit: Payer: Self-pay

## 2021-01-03 VITALS — BP 151/73 | HR 100 | Temp 98.9°F | Resp 18 | Wt 231.6 lb

## 2021-01-03 DIAGNOSIS — D500 Iron deficiency anemia secondary to blood loss (chronic): Secondary | ICD-10-CM | POA: Diagnosis not present

## 2021-01-03 MED ORDER — FERROUS SULFATE 325 (65 FE) MG PO TBEC
325.0000 mg | DELAYED_RELEASE_TABLET | Freq: Every day | ORAL | 3 refills | Status: DC
  Filled 2021-01-03: qty 90, 90d supply, fill #0

## 2021-01-03 NOTE — Progress Notes (Signed)
[name], [name]   CLINIC:  Medical Oncology/Hematology  PCP:  Pcp, No None  None  REASON FOR VISIT:  Follow-up for IDA  PRIOR THERAPY: Iron tablets daily  CURRENT THERAPY: Intermittent Venofer last on 09/07/2020  INTERVAL HISTORY:  [name], a 50 y.o. female, returns for routine follow-up for her IDA. [name] was last seen by Dr. Nicholas Lose on 08/18/2020.  Today she reports feeling okay. She reports that her energy levels improved after the last Venofer infusions. She continues having heavy menses with heavy bleeding on the first day and taper for up to 10 days, even spotting 2 weeks later. She denies having melena, hematochezia or leg swelling. She took iron tablets previously and tolerated it well, but was discontinued due to liver issues. She currently has sinus allergies which she gets every spring. She used to have ice pica, but denies having it currently.  She is scheduled to have a radio frequency ablation with Sonata on 04/29 with Dr. Servando Salina.   REVIEW OF SYSTEMS:  Review of Systems  Constitutional: Positive for appetite change (75%). Negative for fatigue.  Cardiovascular: Negative for leg swelling.  Gastrointestinal: Negative for blood in stool.  Genitourinary: Positive for menstrual problem (heavy bleeding on first day & tapering).   All other systems reviewed and are negative.   PAST MEDICAL/SURGICAL HISTORY:  Past Medical History:  Diagnosis Date  . Acid reflux   . Anxiety   . Panic attack   . Thyroid disease   . Tinnitus    Past Surgical History:  Procedure Laterality Date  . BREAST BIOPSY      SOCIAL HISTORY:  Social History   Socioeconomic History  . Marital status: Married    Spouse name: Not on file  . Number of children: 0  . Years of education: Not on file  . Highest education level: Not on file  Occupational History    Employer:   Tobacco Use  . Smoking  status: Never Smoker  . Smokeless tobacco: Never Used  Vaping Use  . Vaping Use: Never used  Substance and Sexual Activity  . Alcohol use: Yes    Comment: occ  . Drug use: No  . Sexual activity: Yes  Other Topics Concern  . Not on file  Social History Narrative  . Not on file   Social Determinants of Health   Financial Resource Strain: Low Risk   . Difficulty of Paying Living Expenses: Not hard at all  Food Insecurity: No Food Insecurity  . Worried About Charity fundraiser in the Last Year: Never true  . Ran Out of Food in the Last Year: Never true  Transportation Needs: No Transportation Needs  . Lack of Transportation (Medical): No  . Lack of Transportation (Non-Medical): No  Physical Activity: Sufficiently Active  . Days of Exercise per Week: 3 days  . Minutes of Exercise per Session: 120 min  Stress: No Stress Concern Present  . Feeling of Stress : Only a little  Social Connections: Socially Integrated  . Frequency of Communication with Friends and Family: More than three times a week  . Frequency of Social Gatherings with Friends and Family: Twice a week  . Attends Religious Services: More than 4 times per year  . Active Member of Clubs or Organizations: Yes  . Attends Archivist Meetings: More than 4 times per year  . Marital Status: Married  Human resources officer Violence:  Not At Risk  . Fear of Current or Ex-Partner: No  . Emotionally Abused: No  . Physically Abused: No  . Sexually Abused: No    FAMILY HISTORY:  Family History  Problem Relation Age of Onset  . Arthritis Mother   . Hypertension Mother   . Hyperlipidemia Father   . Hypertension Father   . Cancer Maternal Aunt        breast  . Breast cancer Maternal Aunt 54  . Cancer Paternal Aunt        breast  . Breast cancer Paternal Aunt 29  . Arthritis Maternal Grandmother   . Stroke Maternal Grandfather   . Diabetes Paternal Grandmother   . Heart disease Paternal Grandfather   . Breast  cancer Paternal Aunt 74    CURRENT MEDICATIONS:  Current Outpatient Medications  Medication Sig Dispense Refill  . Cholecalciferol (VITAMIN D3) 50 MCG (2000 UT) CHEW Chew 2 capsules by mouth daily.    Marland Kitchen esomeprazole (NEXIUM) 40 MG capsule Take 1 capsule (40 mg total) by mouth daily. 90 capsule 2  . ferrous sulfate 325 (65 FE) MG EC tablet Take 1 tablet (325 mg total) by mouth daily with breakfast. 90 tablet 3  . fluticasone (FLONASE) 50 MCG/ACT nasal spray USE 2 SPRAYS IN BOTH NOSTRILS DAILY. 16 g 3  . ipratropium (ATROVENT) 0.03 % nasal spray USE 2 SPRAYS IN EACH NOSTRIL EVERY 12 HOURS 60 mL 10  . Lactobacillus Rhamnosus, GG, (CULTURELLE PO) Take 1 tablet by mouth daily.    Marland Kitchen levothyroxine (SYNTHROID) 25 MCG tablet TAKE 1 TABLET (25 MCG TOTAL) BY MOUTH DAILY. 90 tablet 0  . loratadine (CLARITIN) 10 MG tablet Take 10 mg by mouth daily.    . misoprostol (CYTOTEC) 200 MCG tablet INSERT 1 TABLET VAGINALLY 2 HOURS BEFORE SURGERY 1 tablet 0  . Multiple Vitamins-Iron (MULTIVITAMINS WITH IRON) TABS tablet Take 1 tablet by mouth daily.  0  . naproxen (NAPROSYN) 500 MG tablet Take 1 tablet (500 mg total) by mouth 2 (two) times daily with a meal. During menses 30 tablet 1  . sertraline (ZOLOFT) 100 MG tablet TAKE 1 TABLET (100 MG TOTAL) BY MOUTH DAILY. 30 tablet 2  . vitamin B-12 (CYANOCOBALAMIN) 1000 MCG tablet Take 1,000 mcg by mouth 2 (two) times daily.    . montelukast (SINGULAIR) 10 MG tablet TAKE 1 TABLET (10 MG TOTAL) BY MOUTH AT BEDTIME. 90 tablet 2   No current facility-administered medications for this visit.    ALLERGIES:  No Known Allergies  PHYSICAL EXAM:  Performance status (ECOG): 1 - Symptomatic but completely ambulatory  Vitals:   01/03/21 1605  BP: (!) 151/73  Pulse: 100  Resp: 18  Temp: 98.9 F (37.2 C)  SpO2: 100%   Wt Readings from Last 3 Encounters:  01/03/21 231 lb 9.6 oz (105.1 kg)  08/18/20 226 lb 3.2 oz (102.6 kg)  07/07/20 225 lb (102.1 kg)   Physical  Exam Vitals reviewed.  Constitutional:      Appearance: Normal appearance. She is obese.  Cardiovascular:     Rate and Rhythm: Normal rate and regular rhythm.     Pulses: Normal pulses.     Heart sounds: Normal heart sounds.  Pulmonary:     Effort: Pulmonary effort is normal.     Breath sounds: Normal breath sounds.  Chest:  Breasts:     Right: No axillary adenopathy or supraclavicular adenopathy.     Left: No axillary adenopathy or supraclavicular adenopathy.    Musculoskeletal:  Right lower leg: No edema.     Left lower leg: No edema.  Lymphadenopathy:     Cervical: No cervical adenopathy.     Upper Body:     Right upper body: No supraclavicular, axillary or pectoral adenopathy.     Left upper body: No supraclavicular, axillary or pectoral adenopathy.  Neurological:     General: No focal deficit present.     Mental Status: She is alert and oriented to person, place, and time.  Psychiatric:        Mood and Affect: Mood normal.        Behavior: Behavior normal.     LABORATORY DATA:  I have reviewed the labs as listed.  CBC Latest Ref Rng & Units 12/27/2020 07/07/2020 04/07/2020  WBC 4.0 - 10.5 K/uL 8.1 7.7 6.8  Hemoglobin 12.0 - 15.0 g/dL 12.6 9.2(L) 9.2(L)  Hematocrit 36.0 - 46.0 % 38.9 31.2(L) 29.6(L)  Platelets 150 - 400 K/uL 320 404(H) 427(H)   CMP Latest Ref Rng & Units 12/27/2020 07/07/2020 04/07/2020  Glucose 70 - 99 mg/dL 84 75 78  BUN 6 - 20 mg/dL 12 12 13   Creatinine 0.44 - 1.00 mg/dL 0.79 0.83 0.89  Sodium 135 - 145 mmol/L 136 137 136  Potassium 3.5 - 5.1 mmol/L 3.9 4.3 3.9  Chloride 98 - 111 mmol/L 104 106 102  CO2 22 - 32 mmol/L 25 23 21   Calcium 8.9 - 10.3 mg/dL 9.2 9.0 9.1  Total Protein 6.5 - 8.1 g/dL 7.7 7.0 7.2  Total Bilirubin 0.3 - 1.2 mg/dL 0.3 0.3 0.2  Alkaline Phos 38 - 126 U/L 66 - -  AST 15 - 41 U/L 16 14 15   ALT 0 - 44 U/L 20 11 12       Component Value Date/Time   RBC 4.29 12/27/2020 1553   MCV 90.7 12/27/2020 1553   MCH 29.4  12/27/2020 1553   MCHC 32.4 12/27/2020 1553   RDW 13.2 12/27/2020 1553   LYMPHSABS 2.8 12/27/2020 1553   MONOABS 0.9 12/27/2020 1553   EOSABS 0.2 12/27/2020 1553   BASOSABS 0.1 12/27/2020 1553   Lab Results  Component Value Date   TIBC 448 12/27/2020   TIBC 450 07/07/2020   TIBC CANCELED 04/07/2020   FERRITIN 11 12/27/2020   FERRITIN 4 (L) 07/07/2020   FERRITIN 5 (L) 04/07/2020   IRONPCTSAT 9 (L) 12/27/2020   IRONPCTSAT 5 (L) 07/07/2020   IRONPCTSAT 50 (H) 09/30/2019    DIAGNOSTIC IMAGING:  I have independently reviewed the scans and discussed with the patient. No results found.   ASSESSMENT:  1.  Iron deficiency anemia due to uterine fibroid: -Anemia from menorrhagia.  Menstrual periods 10/28, with 2 to 3 days of heavy bleeding.  Denies any rectal bleeding. - Received Venofer 300 mg x3 doses from 08/24/2020 through 08/30/2020 with improvement in energy levels. - She works in Conservator, museum/gallery at [employer].   PLAN:  1.  Iron deficiency anemia due to uterine fibroid: - Reviewed labs from 12/27/2020.  Ferritin is low at 11.  However hemoglobin improved to 12.6. - She reported improvement in energy levels after last infusion of Venofer. - At this time no indication for parenteral iron therapy. - Recommend start taking iron tablet daily.  She has taken it briefly and tolerated it very well. - She is having D&C and Sonata procedure for her fibroids to minimize bleeding this Friday. - Recommend follow-up in 3 months with repeat labs.  2.  Fatty liver: - Ultrasound of abdomen  on 10/09/2019 shows diffuse increased liver echogenicity, indicative of hepatic steatosis. - Discussed risk complications of fatty liver.  Patient will try start losing weight after the GYN procedure.  Orders placed this encounter:  Orders Placed This Encounter  Procedures  . CBC with Differential/Platelet  . Ferritin  . Iron and TIBC     Derek Jack, MD [place] [phone]   I,  Milinda Antis, am acting as a scribe for Dr. Sanda Linger.  I, Derek Jack MD, have reviewed the above documentation for accuracy and completeness, and I agree with the above.

## 2021-01-03 NOTE — Patient Instructions (Signed)
[place] at [facility] Discharge Instructions  You were seen today by Dr. Delton Coombes. He went over your recent results. Start taking 1 iron tablet daily to maintain your hemoglobin and energy levels. Your next appointment will be with the physician assistant in 3 months for labs and follow up.   Thank you for choosing [place] at [facility] to provide your oncology and hematology care.  To afford each patient quality time with our provider, please arrive at least 15 minutes before your scheduled appointment time.   If you have a lab appointment with the [facility] please come in thru the Main Entrance and check in at the main information desk  You need to re-schedule your appointment should you arrive 10 or more minutes late.  We strive to give you quality time with our providers, and arriving late affects you and other patients whose appointments are after yours.  Also, if you no show three or more times for appointments you may be dismissed from the clinic at the providers discretion.     Again, thank you for choosing [facility]  Our hope is that these requests will decrease the amount of time that you wait before being seen by our physicians.       _____________________________________________________________  Should you have questions after your visit to [facility], please contact our office at [phone] between the hours of 8:00 a.m. and 4:30 p.m.  Voicemails left after 4:00 p.m. will not be returned until the following business day.  For prescription refill requests, have your pharmacy contact our office and allow 72 hours.    Cancer Center Support Programs:   > Cancer Support Group  2nd Tuesday of the month 1pm-2pm, Journey Room

## 2021-01-04 ENCOUNTER — Other Ambulatory Visit: Payer: Self-pay

## 2021-01-04 ENCOUNTER — Encounter (HOSPITAL_BASED_OUTPATIENT_CLINIC_OR_DEPARTMENT_OTHER): Payer: Self-pay | Admitting: Obstetrics and Gynecology

## 2021-01-04 NOTE — Progress Notes (Signed)
Spoke w/ via phone for pre-op interview--- PT Lab needs dos----Urine preg, T&S               Lab results------ pt has current lab dated 12-27-2020 in epic, CBCdiff / CMP COVID test ------ done 01-05-2021 @ 1200 Arrive at -------  0600 on 01-07-2021 NPO after MN NO Solid Food.  Clear liquids from MN until--- 0500 Med rec completed Medications to take morning of surgery ----- Synthroid, Flonase spray Diabetic medication ----- n/a Patient instructed to bring photo id and insurance card day of surgery Patient aware to have Driver (ride ) / caregiver    for 24 hours after surgery-- husband, Einar Grad Patient Special Instructions ----- n/a Pre-Op special Istructions ----- n/a Patient verbalized understanding of instructions that were given at this phone interview. Patient denies shortness of breath, chest pain, fever, cough at this phone interview.

## 2021-01-05 ENCOUNTER — Other Ambulatory Visit (HOSPITAL_COMMUNITY)
Admission: RE | Admit: 2021-01-05 | Discharge: 2021-01-05 | Disposition: A | Discharge status: Home / Self Care | Payer: No Typology Code available for payment source | Source: Ambulatory Visit | Attending: Obstetrics and Gynecology | Admitting: Obstetrics and Gynecology

## 2021-01-05 ENCOUNTER — Other Ambulatory Visit: Payer: Self-pay | Admitting: Obstetrics and Gynecology

## 2021-01-05 DIAGNOSIS — Z01812 Encounter for preprocedural laboratory examination: Secondary | ICD-10-CM | POA: Diagnosis not present

## 2021-01-05 DIAGNOSIS — U071 COVID-19: Secondary | ICD-10-CM | POA: Insufficient documentation

## 2021-01-05 DIAGNOSIS — Z1231 Encounter for screening mammogram for malignant neoplasm of breast: Secondary | ICD-10-CM

## 2021-01-05 LAB — SARS CORONAVIRUS 2 (TAT 6-24 HRS): SARS Coronavirus 2: POSITIVE — AB

## 2021-01-06 ENCOUNTER — Telehealth (HOSPITAL_COMMUNITY): Payer: Self-pay

## 2021-01-06 NOTE — Progress Notes (Signed)
Called becky at dr cousins office and made aware pt covid positive test 01-05-2021, becky to make dr cousins aware.

## 2021-01-06 NOTE — Telephone Encounter (Signed)
01/06/21 - Contacted surgeon's office - spoke to [facility] - advised of patient's Positive Covid19 lab results - Jacqlyn Larsen advised they were aware and procedure has already been canceled. MBM

## 2021-01-07 ENCOUNTER — Ambulatory Visit (HOSPITAL_BASED_OUTPATIENT_CLINIC_OR_DEPARTMENT_OTHER)
Admission: RE | Admit: 2021-01-07 | Payer: No Typology Code available for payment source | Source: Ambulatory Visit | Admitting: Obstetrics and Gynecology

## 2021-01-07 HISTORY — DX: Leiomyoma of uterus, unspecified: D25.9

## 2021-01-07 HISTORY — DX: Gastro-esophageal reflux disease without esophagitis: K21.9

## 2021-01-07 HISTORY — DX: Elevated blood-pressure reading, without diagnosis of hypertension: R03.0

## 2021-01-07 HISTORY — DX: Tinnitus, left ear: H93.12

## 2021-01-07 HISTORY — DX: Presence of spectacles and contact lenses: Z97.3

## 2021-01-07 HISTORY — DX: Excessive and frequent menstruation with regular cycle: N92.0

## 2021-01-07 HISTORY — DX: Allergic rhinitis, unspecified: J30.9

## 2021-01-07 HISTORY — DX: Iron deficiency anemia secondary to blood loss (chronic): D50.0

## 2021-01-07 HISTORY — DX: Generalized anxiety disorder: F41.1

## 2021-01-07 HISTORY — DX: Hypothyroidism, unspecified: E03.9

## 2021-01-07 SURGERY — RADIOFREQUENCY ABLATION, LEIOMYOMA, UTERUS, TRANSCERVICAL APPROACH, WITH US GUIDANCE
Anesthesia: General

## 2021-01-26 ENCOUNTER — Other Ambulatory Visit: Payer: Self-pay | Admitting: Obstetrics and Gynecology

## 2021-02-08 ENCOUNTER — Other Ambulatory Visit: Payer: Self-pay | Admitting: Family Medicine

## 2021-02-08 ENCOUNTER — Other Ambulatory Visit (HOSPITAL_COMMUNITY): Payer: Self-pay

## 2021-02-08 MED ORDER — ESOMEPRAZOLE MAGNESIUM 40 MG PO CPDR
40.0000 mg | DELAYED_RELEASE_CAPSULE | Freq: Every day | ORAL | 0 refills | Status: DC
  Filled 2021-02-08: qty 90, 90d supply, fill #0

## 2021-02-09 ENCOUNTER — Encounter (HOSPITAL_BASED_OUTPATIENT_CLINIC_OR_DEPARTMENT_OTHER): Payer: Self-pay | Admitting: Obstetrics and Gynecology

## 2021-02-09 ENCOUNTER — Other Ambulatory Visit: Payer: Self-pay

## 2021-02-09 NOTE — Progress Notes (Signed)
Spoke w/ via phone for pre-op interview---pt Lab needs dos---- cbc, T & S, urine preg              Lab results------none COVID test ----- positive covid 01-05-2021 result in epic, no retets needed Patient instructed no nail polish to be worn day of surgery Patient instructed to bring photo id and insurance card day of surgery Patient aware to have Driver (ride ) / caregiver  Husband ravi will stay   for 24 hours after surgery  Patient Special Instructions -----none Pre-Op special Istructions -----none Patient verbalized understanding of instructions that were given at this phone interview. Patient denies shortness of breath, chest pain, fever, cough at this phone interview.

## 2021-02-11 ENCOUNTER — Ambulatory Visit (HOSPITAL_BASED_OUTPATIENT_CLINIC_OR_DEPARTMENT_OTHER): Payer: No Typology Code available for payment source | Admitting: Anesthesiology

## 2021-02-11 ENCOUNTER — Other Ambulatory Visit: Payer: Self-pay

## 2021-02-11 ENCOUNTER — Other Ambulatory Visit (HOSPITAL_COMMUNITY): Payer: Self-pay

## 2021-02-11 ENCOUNTER — Encounter (HOSPITAL_BASED_OUTPATIENT_CLINIC_OR_DEPARTMENT_OTHER)
Admission: RE | Disposition: A | Discharge status: Home / Self Care | Payer: Self-pay | Source: Ambulatory Visit | Attending: Obstetrics and Gynecology

## 2021-02-11 ENCOUNTER — Ambulatory Visit (HOSPITAL_BASED_OUTPATIENT_CLINIC_OR_DEPARTMENT_OTHER)
Admission: RE | Admit: 2021-02-11 | Discharge: 2021-02-11 | Disposition: A | Discharge status: Home / Self Care | Payer: No Typology Code available for payment source | Source: Ambulatory Visit | Attending: Obstetrics and Gynecology | Admitting: Obstetrics and Gynecology

## 2021-02-11 DIAGNOSIS — K219 Gastro-esophageal reflux disease without esophagitis: Secondary | ICD-10-CM | POA: Insufficient documentation

## 2021-02-11 DIAGNOSIS — Z833 Family history of diabetes mellitus: Secondary | ICD-10-CM | POA: Diagnosis not present

## 2021-02-11 DIAGNOSIS — Z8349 Family history of other endocrine, nutritional and metabolic diseases: Secondary | ICD-10-CM | POA: Insufficient documentation

## 2021-02-11 DIAGNOSIS — Z8249 Family history of ischemic heart disease and other diseases of the circulatory system: Secondary | ICD-10-CM | POA: Diagnosis not present

## 2021-02-11 DIAGNOSIS — Z803 Family history of malignant neoplasm of breast: Secondary | ICD-10-CM | POA: Insufficient documentation

## 2021-02-11 DIAGNOSIS — Z823 Family history of stroke: Secondary | ICD-10-CM | POA: Diagnosis not present

## 2021-02-11 DIAGNOSIS — N841 Polyp of cervix uteri: Secondary | ICD-10-CM | POA: Diagnosis not present

## 2021-02-11 DIAGNOSIS — D251 Intramural leiomyoma of uterus: Secondary | ICD-10-CM | POA: Insufficient documentation

## 2021-02-11 DIAGNOSIS — Z8261 Family history of arthritis: Secondary | ICD-10-CM | POA: Insufficient documentation

## 2021-02-11 DIAGNOSIS — N920 Excessive and frequent menstruation with regular cycle: Secondary | ICD-10-CM | POA: Insufficient documentation

## 2021-02-11 DIAGNOSIS — Z8616 Personal history of COVID-19: Secondary | ICD-10-CM | POA: Insufficient documentation

## 2021-02-11 DIAGNOSIS — D250 Submucous leiomyoma of uterus: Secondary | ICD-10-CM | POA: Diagnosis not present

## 2021-02-11 DIAGNOSIS — D252 Subserosal leiomyoma of uterus: Secondary | ICD-10-CM | POA: Diagnosis not present

## 2021-02-11 DIAGNOSIS — D259 Leiomyoma of uterus, unspecified: Secondary | ICD-10-CM | POA: Diagnosis present

## 2021-02-11 HISTORY — DX: COVID-19: U07.1

## 2021-02-11 HISTORY — PX: HYSTEROSCOPY WITH RESECTOSCOPE: SHX5395

## 2021-02-11 LAB — CBC
HCT: 38.2 % (ref 36.0–46.0)
Hemoglobin: 12.4 g/dL (ref 12.0–15.0)
MCH: 29 pg (ref 26.0–34.0)
MCHC: 32.5 g/dL (ref 30.0–36.0)
MCV: 89.5 fL (ref 80.0–100.0)
Platelets: 366 10*3/uL (ref 150–400)
RBC: 4.27 MIL/uL (ref 3.87–5.11)
RDW: 13.8 % (ref 11.5–15.5)
WBC: 6.2 10*3/uL (ref 4.0–10.5)
nRBC: 0 % (ref 0.0–0.2)

## 2021-02-11 LAB — TYPE AND SCREEN
ABO/RH(D): O POS
Antibody Screen: NEGATIVE

## 2021-02-11 LAB — ABO/RH: ABO/RH(D): O POS

## 2021-02-11 LAB — POCT PREGNANCY, URINE: Preg Test, Ur: NEGATIVE

## 2021-02-11 SURGERY — RADIOFREQUENCY ABLATION, LEIOMYOMA, UTERUS, TRANSCERVICAL APPROACH, WITH US GUIDANCE
Anesthesia: General | Site: Vagina

## 2021-02-11 MED ORDER — LACTATED RINGERS IV SOLN
INTRAVENOUS | Status: DC
  Administered 2021-02-11: 1000 mL via INTRAVENOUS

## 2021-02-11 MED ORDER — DEXAMETHASONE SODIUM PHOSPHATE 10 MG/ML IJ SOLN
INTRAMUSCULAR | Status: AC
  Filled 2021-02-11: qty 1

## 2021-02-11 MED ORDER — MEPERIDINE HCL 25 MG/ML IJ SOLN: 6.2500 mg | INTRAMUSCULAR | Status: DC | PRN

## 2021-02-11 MED ORDER — MIDAZOLAM HCL 5 MG/5ML IJ SOLN
INTRAMUSCULAR | Status: DC | PRN
  Administered 2021-02-11: 2 mg via INTRAVENOUS

## 2021-02-11 MED ORDER — ACETAMINOPHEN 500 MG PO TABS
1000.0000 mg | ORAL_TABLET | Freq: Once | ORAL | Status: AC
  Administered 2021-02-11: 1000 mg via ORAL

## 2021-02-11 MED ORDER — GLYCOPYRROLATE 0.2 MG/ML IJ SOLN
INTRAMUSCULAR | Status: DC | PRN
  Administered 2021-02-11: .1 mg via INTRAVENOUS

## 2021-02-11 MED ORDER — DEXAMETHASONE SODIUM PHOSPHATE 4 MG/ML IJ SOLN
INTRAMUSCULAR | Status: DC | PRN
  Administered 2021-02-11: 5 mg via INTRAVENOUS

## 2021-02-11 MED ORDER — LIDOCAINE HCL (CARDIAC) PF 100 MG/5ML IV SOSY
PREFILLED_SYRINGE | INTRAVENOUS | Status: DC | PRN
  Administered 2021-02-11: 60 mg via INTRAVENOUS

## 2021-02-11 MED ORDER — FENTANYL CITRATE (PF) 100 MCG/2ML IJ SOLN
INTRAMUSCULAR | Status: AC
  Filled 2021-02-11: qty 2

## 2021-02-11 MED ORDER — CHLOROPROCAINE HCL 1 % IJ SOLN
INTRAMUSCULAR | Status: DC | PRN
  Administered 2021-02-11: 20 mL

## 2021-02-11 MED ORDER — SCOPOLAMINE 1 MG/3DAYS TD PT72
1.0000 | MEDICATED_PATCH | TRANSDERMAL | Status: DC
  Administered 2021-02-11: 1.5 mg via TRANSDERMAL

## 2021-02-11 MED ORDER — GLYCOPYRROLATE PF 0.2 MG/ML IJ SOSY
PREFILLED_SYRINGE | INTRAMUSCULAR | Status: AC
  Filled 2021-02-11: qty 1

## 2021-02-11 MED ORDER — SCOPOLAMINE 1 MG/3DAYS TD PT72
MEDICATED_PATCH | TRANSDERMAL | Status: AC
  Filled 2021-02-11: qty 1

## 2021-02-11 MED ORDER — KETOROLAC TROMETHAMINE 30 MG/ML IJ SOLN
INTRAMUSCULAR | Status: AC
  Filled 2021-02-11: qty 1

## 2021-02-11 MED ORDER — ONDANSETRON HCL 4 MG/2ML IJ SOLN
INTRAMUSCULAR | Status: AC
  Filled 2021-02-11: qty 2

## 2021-02-11 MED ORDER — PROPOFOL 10 MG/ML IV BOLUS
INTRAVENOUS | Status: AC
  Filled 2021-02-11: qty 20

## 2021-02-11 MED ORDER — KETOROLAC TROMETHAMINE 30 MG/ML IJ SOLN: 30.0000 mg | Freq: Once | INTRAMUSCULAR | Status: DC | PRN

## 2021-02-11 MED ORDER — SODIUM CHLORIDE 0.9 % IR SOLN
Status: DC | PRN
  Administered 2021-02-11 (×2): 3000 mL

## 2021-02-11 MED ORDER — HYDROMORPHONE HCL 1 MG/ML IJ SOLN: 0.2500 mg | INTRAMUSCULAR | Status: DC | PRN

## 2021-02-11 MED ORDER — FENTANYL CITRATE (PF) 100 MCG/2ML IJ SOLN
INTRAMUSCULAR | Status: DC | PRN
  Administered 2021-02-11: 25 ug via INTRAVENOUS
  Administered 2021-02-11 (×2): 50 ug via INTRAVENOUS
  Administered 2021-02-11: 75 ug via INTRAVENOUS

## 2021-02-11 MED ORDER — ACETAMINOPHEN 500 MG PO TABS
ORAL_TABLET | ORAL | Status: AC
  Filled 2021-02-11: qty 2

## 2021-02-11 MED ORDER — OXYCODONE-ACETAMINOPHEN 5-325 MG PO TABS
1.0000 | ORAL_TABLET | Freq: Four times a day (QID) | ORAL | 0 refills | Status: AC | PRN
  Filled 2021-02-11: qty 30, 8d supply, fill #0

## 2021-02-11 MED ORDER — AMISULPRIDE (ANTIEMETIC) 5 MG/2ML IV SOLN: 10.0000 mg | Freq: Once | INTRAVENOUS | Status: DC | PRN

## 2021-02-11 MED ORDER — PROPOFOL 10 MG/ML IV BOLUS
INTRAVENOUS | Status: DC | PRN
  Administered 2021-02-11: 200 mg via INTRAVENOUS

## 2021-02-11 MED ORDER — IBUPROFEN 800 MG PO TABS
800.0000 mg | ORAL_TABLET | Freq: Three times a day (TID) | ORAL | 11 refills | Status: DC | PRN
  Filled 2021-02-11: qty 30, 10d supply, fill #0

## 2021-02-11 MED ORDER — MIDAZOLAM HCL 2 MG/2ML IJ SOLN
INTRAMUSCULAR | Status: AC
  Filled 2021-02-11: qty 2

## 2021-02-11 MED ORDER — OXYCODONE HCL 5 MG PO TABS: 5.0000 mg | ORAL_TABLET | Freq: Once | ORAL | Status: DC | PRN

## 2021-02-11 MED ORDER — ONDANSETRON HCL 4 MG/2ML IJ SOLN
INTRAMUSCULAR | Status: DC | PRN
  Administered 2021-02-11: 4 mg via INTRAVENOUS

## 2021-02-11 MED ORDER — LIDOCAINE 2% (20 MG/ML) 5 ML SYRINGE
INTRAMUSCULAR | Status: AC
  Filled 2021-02-11: qty 5

## 2021-02-11 MED ORDER — PROMETHAZINE HCL 25 MG/ML IJ SOLN: 6.2500 mg | INTRAMUSCULAR | Status: DC | PRN

## 2021-02-11 MED ORDER — POVIDONE-IODINE 10 % EX SWAB: 2.0000 "application " | Freq: Once | CUTANEOUS | Status: DC

## 2021-02-11 MED ORDER — KETOROLAC TROMETHAMINE 30 MG/ML IJ SOLN
INTRAMUSCULAR | Status: DC | PRN
  Administered 2021-02-11: 30 mg via INTRAVENOUS

## 2021-02-11 MED ORDER — OXYCODONE HCL 5 MG/5ML PO SOLN: 5.0000 mg | Freq: Once | ORAL | Status: DC | PRN

## 2021-02-11 SURGICAL SUPPLY — 20 items
CATH ROBINSON RED A/P 16FR (CATHETERS) ×3 IMPLANT
COVER WAND RF STERILE (DRAPES) ×3 IMPLANT
DEVICE MYOSURE REACH (MISCELLANEOUS) ×3 IMPLANT
ELECT DISPERSIVE SONATA (MISCELLANEOUS) ×6 IMPLANT
GAUZE 4X4 16PLY RFD (DISPOSABLE) ×3 IMPLANT
GLOVE SURG ENC MOIS LTX SZ7 (GLOVE) ×3 IMPLANT
GLOVE SURG LTX SZ6.5 (GLOVE) ×3 IMPLANT
GLOVE SURG POLYISO LF SZ7 (GLOVE) ×3 IMPLANT
GLOVE SURG UNDER POLY LF SZ7 (GLOVE) ×6 IMPLANT
GOWN STRL REUS W/TWL LRG LVL3 (GOWN DISPOSABLE) ×3 IMPLANT
HANDPIECE RFA SONATA (MISCELLANEOUS) ×3 IMPLANT
KIT PROCEDURE FLUENT (KITS) ×3 IMPLANT
KIT TURNOVER CYSTO (KITS) ×3 IMPLANT
PACK VAGINAL MINOR WOMEN LF (CUSTOM PROCEDURE TRAY) ×3 IMPLANT
PAD OB MATERNITY 4.3X12.25 (PERSONAL CARE ITEMS) ×3 IMPLANT
PAD PREP 24X48 CUFFED NSTRL (MISCELLANEOUS) ×3 IMPLANT
SEAL ROD LENS SCOPE MYOSURE (ABLATOR) ×3 IMPLANT
SYR 50ML LL SCALE MARK (SYRINGE) ×3 IMPLANT
TOWEL OR 17X26 10 PK STRL BLUE (TOWEL DISPOSABLE) ×3 IMPLANT
WATER STERILE IRR 500ML POUR (IV SOLUTION) ×3 IMPLANT

## 2021-02-11 NOTE — Anesthesia Postprocedure Evaluation (Signed)
Anesthesia Post Note  Patient: [name]  Procedure(s) Performed: Radio Frequency Ablation with Sonata (N/A ) HYSTEROSCOPY WITH MYOSURE (N/A Vagina )     Patient location during evaluation: PACU Anesthesia Type: General Level of consciousness: awake and alert, oriented and patient cooperative Pain management: pain level controlled Vital Signs Assessment: post-procedure vital signs reviewed and stable Respiratory status: spontaneous breathing, nonlabored ventilation and respiratory function stable Cardiovascular status: blood pressure returned to baseline and stable Postop Assessment: no apparent nausea or vomiting Anesthetic complications: no   No complications documented.  Last Vitals:  Vitals:   02/11/21 0918 02/11/21 1145  BP: (!) 149/92 128/81  Pulse: 100 87  Resp: 20 18  Temp: 36.9 C   SpO2: 100% 100%    Last Pain:  Vitals:   02/11/21 0918  TempSrc: Oral  PainSc: [place]

## 2021-02-11 NOTE — Anesthesia Procedure Notes (Signed)
Procedure Name: LMA Insertion Date/Time: 02/11/2021 10:17 AM Performed by: Bufford Spikes, CRNA Pre-anesthesia Checklist: Patient identified, Emergency Drugs available, Suction available and Patient being monitored Patient Re-evaluated:Patient Re-evaluated prior to induction Oxygen Delivery Method: Circle system utilized Preoxygenation: Pre-oxygenation with 100% oxygen Induction Type: IV induction Ventilation: Mask ventilation without difficulty LMA: LMA inserted LMA Size: 4.0 Number of attempts: 1 Airway Equipment and Method: Bite block Placement Confirmation: positive ETCO2 Tube secured with: Tape Dental Injury: Teeth and Oropharynx as per pre-operative assessment

## 2021-02-11 NOTE — Transfer of Care (Signed)
Immediate Anesthesia Transfer of Care Note  Patient: [name]  Procedure(s) Performed: Radio Frequency Ablation with Sonata (N/A ) HYSTEROSCOPY WITH MYOSURE (N/A Vagina )  Patient Location: PACU  Anesthesia Type:General  Level of Consciousness: awake, alert  and oriented  Airway & Oxygen Therapy: Patient Spontanous Breathing and Patient connected to nasal cannula oxygen  Post-op Assessment: Report given to RN and Post -op Vital signs reviewed and stable  Post vital signs: Reviewed and stable  Last Vitals:  Vitals Value Taken Time  BP 126/74 02/11/21 1140  Temp    Pulse 98 02/11/21 1144  Resp 23 02/11/21 1144  SpO2 100 % 02/11/21 1144  Vitals shown include unvalidated device data.  Last Pain:  Vitals:   02/11/21 0918  TempSrc: Oral  PainSc: 4       Patients Stated Pain Goal: 6 (15/86/82 5749)  Complications: No complications documented.

## 2021-02-11 NOTE — Anesthesia Preprocedure Evaluation (Addendum)
Anesthesia Evaluation  Patient identified by MRN, date of birth, ID band Patient awake    Reviewed: Allergy & Precautions, NPO status , Patient's Chart, lab work & pertinent test results  Airway Mallampati: II  TM Distance: >3 FB Neck ROM: Full    Dental  (+) Teeth Intact, Dental Advisory Given   Pulmonary  COVID 01/05/21 Sx were fatigue, currently asymptomatic   Pulmonary exam normal breath sounds clear to auscultation       Cardiovascular hypertension (white coat HTN), Normal cardiovascular exam+ Valvular Problems/Murmurs (mild MR) MR  Rhythm:Regular Rate:Normal  Echo 10/2019: Left ventricle cavity is normal in size. Mild concentric hypertrophy of  the left ventricle. Normal global wall motion. Normal LV systolic function  with EF 55%. Normal diastolic filling pattern.  Mild (Grade I) mitral regurgitation.  Inadequate TR jet to estimate pulmonary artery systolic pressure. Normal  right atrial pressure.    Neuro/Psych PSYCHIATRIC DISORDERS Anxiety Depression negative neurological ROS     GI/Hepatic Neg liver ROS, GERD  Medicated and Controlled,  Endo/Other  Hypothyroidism Morbid obesityBMI 44  Renal/GU negative Renal ROS  Female GU complaint (uterine fibroid, menorrhagia )     Musculoskeletal negative musculoskeletal ROS (+)   Abdominal (+) + obese,   Peds  Hematology  (+) Blood dyscrasia (2/2 menorrhagia, has been getting iron transfusions), anemia , Last H/H 12.6/38.9   Anesthesia Other Findings   Reproductive/Obstetrics negative OB ROS                            Anesthesia Physical Anesthesia Plan  ASA: III  Anesthesia Plan: General   Post-op Pain Management:    Induction: Intravenous  PONV Risk Score and Plan: 4 or greater and Ondansetron, Dexamethasone, Midazolam, Scopolamine patch - Pre-op and Treatment may vary due to age or medical condition  Airway Management Planned:  LMA  Additional Equipment: None  Intra-op Plan:   Post-operative Plan: Extubation in OR  Informed Consent: I have reviewed the patients History and Physical, chart, labs and discussed the procedure including the risks, benefits and alternatives for the proposed anesthesia with the patient or authorized representative who has indicated his/her understanding and acceptance.     Dental advisory given  Plan Discussed with: CRNA  Anesthesia Plan Comments:        Anesthesia Quick Evaluation

## 2021-02-11 NOTE — Interval H&P Note (Signed)
History and Physical Interval Note:  02/11/2021 9:59 AM  [name]  has presented today for surgery, with the diagnosis of fibroid, menorrahgia.  The various methods of treatment have been discussed with the patient and family. After consideration of risks, benefits and other options for treatment, the patient has consented to  DIAGNOSTIC HYSTEROSCOPY, [place], HYSTEROSCOPIC RESECTION OF ENDOMETRIAL/ENDOCERVICAL POLYP, DILATION AND CURETTAGE  as a surgical intervention.  The patient's history has been reviewed, patient examined, no change in status, stable for surgery.  I have reviewed the patient's chart and labs.  Questions were answered to the patient's satisfaction.     Luis Sami A Ivaan Liddy

## 2021-02-11 NOTE — H&P (Signed)
[name] is an 50 y.o. female. BF presents for surgical mgmt of uterine fibroid. Pt is scheduled for dx hysteroscopy, RF transcervical ablation of uterine fibroids, D&C  Pertinent Gynecological History: Menses: flow is excessive with use of 5 pads or tampons on heaviest days Bleeding: menorrhagia Contraception: none DES exposure: denies Blood transfusions: none Sexually transmitted diseases: no past history Previous GYN Procedures: c/s  Last mammogram: normal Date: 2021 Last pap: normal Date: 2021 OB History:   Menstrual History: Menarche age: n/a Patient's last menstrual period was 02/06/2021.    Past Medical History:  Diagnosis Date  . Chronic allergic rhinitis   . COVID 01-05-2021 result in epic   fatigue x 3 days  . Elevated blood-pressure reading without diagnosis of hypertension    per pt being monitored by pcp, pt feels gets elevated in doctor office and with anxiety    (11-10-2019 cardiac CT showed normal coronaries and cal score zero;  echo 10-20-2019  ef 55%,G1DD, mild LVH, mild MR)  . GAD (generalized anxiety disorder)   . GERD (gastroesophageal reflux disease)   . Hypothyroidism   . Iron deficiency anemia secondary to blood loss (chronic) hematology/ onocology--- dr Delton Coombes   secondary to uterine fibroids,  treated with oral iron and iron infusions  . Menorrhagia   . Panic attack   . Tinnitus of left ear    chronic  . Uterine fibroid   . Wears glasses     Past Surgical History:  Procedure Laterality Date  . BREAST BIOPSY    . CERVICAL CERCLAGE    . CESAREAN SECTION      Family History  Problem Relation Age of Onset  . Arthritis Mother   . Hypertension Mother   . Hyperlipidemia Father   . Hypertension Father   . Cancer Maternal Aunt        breast  . Breast cancer Maternal Aunt 72  . Cancer Paternal Aunt        breast  . Breast cancer Paternal Aunt 17  . Arthritis Maternal Grandmother   . Stroke Maternal Grandfather   . Diabetes Paternal  Grandmother   . Heart disease Paternal Grandfather   . Breast cancer Paternal Aunt 74    Social History:  reports that she has never smoked. She has never used smokeless tobacco. She reports current alcohol use. She reports that she does not use drugs.  Allergies: No Known Allergies  No medications prior to admission.    Review of Systems  All other systems reviewed and are negative.   Height 5\' 1"  (1.549 m), weight 104.3 kg, last menstrual period 02/06/2021. Physical Exam Constitutional:      Appearance: Normal appearance.  HENT:     Head: Atraumatic.  Eyes:     Extraocular Movements: Extraocular movements intact.  Cardiovascular:     Rate and Rhythm: Regular rhythm.     Heart sounds: Normal heart sounds.  Abdominal:     Palpations: Abdomen is soft.  Genitourinary:    General: Normal vulva.     Comments: Vagina  No lesion  cervix parous  uterus AV irregular Adnexa nl Musculoskeletal:        General: Normal range of motion.     Cervical back: Neck supple.  Skin:    General: Skin is warm and dry.  Neurological:     Mental Status: She is alert.  Psychiatric:        Mood and Affect: Mood normal.        Behavior:  Behavior normal.     No results found for this or any previous visit (from the past 24 hour(s)).  No results found.  Assessment/Plan: Symptomatic uterine fibroid P) dx hysteroscopy, RF transcervical ablation of uterine fibroid, D&C.procedure explained. Risk of surgery reviewed including infection, bleeding, injury to surrounding organ structures, thermal injury, uterine perforation ( 09/998) and its risk, fluid overload. All ? answered  Wilburta Milbourn A Dionisia Pacholski 02/11/2021, 5:40 AM

## 2021-02-11 NOTE — Discharge Instructions (Signed)
CALL  IF TEMP>100.4, NOTHING PER VAGINA X 2 WK, CALL IF SOAKING A MAXI  PAD EVERY HOUR OR MORE FREQUENTLY  DISCHARGE INSTRUCTIONS: D&C / D&E The following instructions have been prepared to help you care for yourself upon your return home.   Personal hygiene: Marland Kitchen Use sanitary pads for vaginal drainage, not tampons. . Shower the day after your procedure. . NO tub baths, pools or Jacuzzis for 2-3 weeks. . Wipe front to back after using the bathroom.  Activity and limitations: . Do NOT drive or operate any equipment for 24 hours. The effects of anesthesia are still present and drowsiness may result. . Do NOT rest in bed all day. . Walking is encouraged. . Walk up and down stairs slowly. . You may resume your normal activity in one to two days or as indicated by your physician.  Sexual activity: NO intercourse for at least 2 weeks after the procedure, or as indicated by your physician.  Diet: Eat a light meal as desired this evening. You may resume your usual diet tomorrow.  Return to work: You may resume your work activities in one to two days or as indicated by your doctor.  What to expect after your surgery: Expect to have vaginal bleeding/discharge for 2-3 days and spotting for up to 10 days. It is not unusual to have soreness for up to 1-2 weeks. You may have a slight burning sensation when you urinate for the first day. Mild cramps may continue for a couple of days. You may have a regular period in 2-6 weeks.  Call your doctor for any of the following: . Excessive vaginal bleeding, saturating and changing one pad every hour. . Inability to urinate 6 hours after discharge from hospital. . Pain not relieved by pain medication. . Fever of 100.4 F or greater. . Unusual vaginal discharge or odor.   Post Anesthesia Home Care Instructions  Activity: Get plenty of rest for the remainder of the day. A responsible individual must stay with you for 24 hours following the procedure.  For  the next 24 hours, DO NOT: -Drive a car -Paediatric nurse -Drink alcoholic beverages -Take any medication unless instructed by your physician -Make any legal decisions or sign important papers.  Meals: Start with liquid foods such as gelatin or soup. Progress to regular foods as tolerated. Avoid greasy, spicy, heavy foods. If nausea and/or vomiting occur, drink only clear liquids until the nausea and/or vomiting subsides. Call your physician if vomiting continues.  Special Instructions/Symptoms: Your throat may feel dry or sore from the anesthesia or the breathing tube placed in your throat during surgery. If this causes discomfort, gargle with warm salt water. The discomfort should disappear within 24 hours.  If you had a scopolamine patch placed behind your ear for the management of post- operative nausea and/or vomiting:  1. The medication in the patch is effective for 72 hours, after which it should be removed.  Wrap patch in a tissue and discard in the trash. Wash hands thoroughly with soap and water. 2. You may remove the patch earlier than 72 hours if you experience unpleasant side effects which may include dry mouth, dizziness or visual disturbances. 3. Avoid touching the patch. Wash your hands with soap and water after contact with the patch.

## 2021-02-11 NOTE — Brief Op Note (Signed)
02/11/2021  11:54 AM  PATIENT:  [name]  50 y.o. female  PRE-OPERATIVE DIAGNOSIS:  menorrhagia with regular cycles, uterine fibroids, endometrial/endocervical polyp  POST-OPERATIVE DIAGNOSIS:  intramural/subserosal/SM fibroids, endocervical polyp, menorrhagia with regular cycle  PROCEDURE:   hysteroscopic RF transcervical ablation of uterine fibroids using Sonata, diagnostic hysteroscopy, resection of SM fibroid, endocervical polypectomy, D&C  SURGEON:  Surgeon(s) and Role:    * Jaydee Conran, Alanda Slim, MD - Primary  PHYSICIAN ASSISTANT:   ASSISTANTS: none   ANESTHESIA:   general and paracervical block FINDINGS: posterior SM fibroid, fundal SS fibroid, several IM fibroids, endocervical polyp, tubal ostia seen EBL:  25 mL   BLOOD ADMINISTERED:none  DRAINS: none   LOCAL MEDICATIONS USED:  OTHER 1% nesicaine  SPECIMEN:  Source of Specimen:  emc, fibroid resection, endocervical polyp  DISPOSITION OF SPECIMEN:  PATHOLOGY  COUNTS:  YES  TOURNIQUET:  * No tourniquets in log *  DICTATION: .Other Dictation: Dictation Number [id number]  PLAN OF CARE: Discharge to home after PACU  PATIENT DISPOSITION:  PACU - hemodynamically stable.   Delay start of Pharmacological VTE agent (>24hrs) due to surgical blood loss or risk of bleeding: no

## 2021-02-12 NOTE — Op Note (Signed)
NAME: [name], STARE MEDICAL RECORD NO: [id number] ACCOUNT NO: [id number] DATE OF BIRTH: 1971/07/12 FACILITY: [facility] LOCATION: [facility] PHYSICIAN: Jailene Cupit A. Garwin Brothers, MD  Operative Report   DATE OF PROCEDURE: 02/11/2021  PREOPERATIVE DIAGNOSES:  Symptomatic uterine fibroids, endometrial/endocervical polyp.  PROCEDURE:  Radiofrequency transcervical ablation of uterine fibroids using the Sonata,hysteroscopic resection of submucosal fibroid, endocervical polypectomy, dilation and curettage.  POSTOPERATIVE DIAGNOSES:  Submucosal,intramural and subserosal fibroids, endocervical polyp, menorrhagia with regular cycles.  ANESTHESIA:  General, paracervical block.  SURGEON:  Myah Guynes A. Garwin Brothers, MD  ASSISTANT:  None.  DESCRIPTION OF PROCEDURE:  Under adequate general anesthesia, the patient was placed in the dorsal lithotomy position.  She was sterilely prepped and draped in the usual fashion.  Bladder was catheterized, moderate amount of urine. Examination under  anesthesia revealed an anteflexed uterus.  No adnexal masses could be appreciated.  Uterus was irregular in shape.  Bivalve speculum was placed in the vagina.  20 mL of 1% Nesacaine was injected paracervically at 3 and 9 o'clock position.  Cervix was  grasped with a single tooth tenaculum.  The cervix was dilated up to #27 [facility] dilator.  An operative hysteroscope was inserted and the uterine cavity was visualized.  Both tubal ostia were noted to be normal.  Notable finding was a posterior submucosal  fibroid. Using the Reach resectoscope, portion of the submucosal fibroid was resected.   The Sonata handpiece was then inserted and a complete uterine survey was performed with the ultrasound portion of the Sonata.   Using the Sonata, RF transcervical fibroid ablations were then performed at the following locations. A mid anterior fibroid was ablated x2, 2.4 x 1.7 for a time of 1 minute 54 seconds. Ablation  size 2 was 2.8 x 2.0 with  ablative time of 2 minutes and 36 seconds. A left mid lateral fibroid at the 9 o'clock position, about 3 cm in size and ablation size of 2.8 x 2 and a time of 2 minutes and 36 seconds and another fibroid, which was lower uterine segment mid was ablated, 11 o'clock position with a size of 2.2 x 1.5 with an ablative time of 1 minute and 36 seconds.  All the ablative cycles were carried out under direct ultrasound intrauterine guidance. Visualization with the ablation guide noted  to be within the serosa at all times. The fibroid treated appeared ablated  noted by the ultrasound snowflake appearance.  Hysteroscopy was then performed, which showed ablation on the endometrial surface superior to the submucosal fibroid that was present.  The posterior submucosal fibroid part was which was resected showed evidence of the radiofrequency ablation on the surface of the remaining portion of that fibroid. Additional resection of that fibroid using the Reach myosure as well as the remaining endometrial cavity was done.   On removing the hysteroscope, the endocervical polyp was noted and that was resected using the REACH.  All instruments were then subsequently removed from the vagina.  Hemostasis was noted.  Specimen labeled endocervical polyp, endometrial curetting,  endometrial fibroid resection was sent to pathology.  Fluid deficit was 1486 mL.  Estimated blood loss was 25 mL. Intraoperative fluids was 800 mL.  Complication was none.  The patient tolerated the procedure well and was transferred to recovery  room in  stable condition.   SHW D: 02/11/2021 11:01:22 pm T: 02/12/2021 12:09:00 am  JOB: [id number]/ [id number]

## 2021-02-14 ENCOUNTER — Encounter (HOSPITAL_BASED_OUTPATIENT_CLINIC_OR_DEPARTMENT_OTHER): Payer: Self-pay | Admitting: Obstetrics and Gynecology

## 2021-02-14 LAB — SURGICAL PATHOLOGY

## 2021-02-25 ENCOUNTER — Ambulatory Visit
Admission: RE | Admit: 2021-02-25 | Discharge: 2021-02-25 | Disposition: A | Discharge status: Home / Self Care | Payer: No Typology Code available for payment source | Source: Ambulatory Visit | Attending: Obstetrics and Gynecology | Admitting: Obstetrics and Gynecology

## 2021-02-25 ENCOUNTER — Other Ambulatory Visit: Payer: Self-pay

## 2021-02-25 DIAGNOSIS — Z1231 Encounter for screening mammogram for malignant neoplasm of breast: Secondary | ICD-10-CM

## 2021-03-31 ENCOUNTER — Inpatient Hospital Stay (HOSPITAL_COMMUNITY): Payer: No Typology Code available for payment source

## 2021-04-04 ENCOUNTER — Inpatient Hospital Stay (HOSPITAL_COMMUNITY): Payer: No Typology Code available for payment source | Attending: Hematology

## 2021-04-07 NOTE — Progress Notes (Deleted)
NO SHOW

## 2021-04-08 ENCOUNTER — Ambulatory Visit (HOSPITAL_COMMUNITY): Payer: No Typology Code available for payment source | Admitting: Physician Assistant

## 2021-04-15 ENCOUNTER — Encounter: Payer: Self-pay | Admitting: Nurse Practitioner

## 2021-04-15 ENCOUNTER — Other Ambulatory Visit: Payer: Self-pay

## 2021-04-15 ENCOUNTER — Other Ambulatory Visit (HOSPITAL_COMMUNITY): Payer: Self-pay

## 2021-04-15 ENCOUNTER — Ambulatory Visit (INDEPENDENT_AMBULATORY_CARE_PROVIDER_SITE_OTHER): Payer: No Typology Code available for payment source | Admitting: Nurse Practitioner

## 2021-04-15 VITALS — BP 120/78 | HR 85 | Temp 98.7°F | Ht 61.0 in | Wt 231.6 lb

## 2021-04-15 DIAGNOSIS — Z0001 Encounter for general adult medical examination with abnormal findings: Secondary | ICD-10-CM

## 2021-04-15 DIAGNOSIS — J309 Allergic rhinitis, unspecified: Secondary | ICD-10-CM

## 2021-04-15 DIAGNOSIS — Z0000 Encounter for general adult medical examination without abnormal findings: Secondary | ICD-10-CM

## 2021-04-15 DIAGNOSIS — Z13220 Encounter for screening for lipoid disorders: Secondary | ICD-10-CM

## 2021-04-15 DIAGNOSIS — K219 Gastro-esophageal reflux disease without esophagitis: Secondary | ICD-10-CM

## 2021-04-15 DIAGNOSIS — E039 Hypothyroidism, unspecified: Secondary | ICD-10-CM

## 2021-04-15 DIAGNOSIS — F411 Generalized anxiety disorder: Secondary | ICD-10-CM

## 2021-04-15 DIAGNOSIS — F4321 Adjustment disorder with depressed mood: Secondary | ICD-10-CM

## 2021-04-15 DIAGNOSIS — Z131 Encounter for screening for diabetes mellitus: Secondary | ICD-10-CM

## 2021-04-15 MED ORDER — FLUTICASONE PROPIONATE 50 MCG/ACT NA SUSP
2.0000 | Freq: Every day | NASAL | 10 refills | Status: DC
  Filled 2021-04-15 – 2021-08-17 (×2): qty 16, 30d supply, fill #0
  Filled 2021-10-31: qty 16, 30d supply, fill #1
  Filled 2021-12-12: qty 16, 30d supply, fill #2

## 2021-04-15 MED ORDER — IPRATROPIUM BROMIDE 0.03 % NA SOLN
2.0000 | Freq: Two times a day (BID) | NASAL | 10 refills | Status: DC
  Filled 2021-04-15: qty 60, 75d supply, fill #0

## 2021-04-15 MED ORDER — ESOMEPRAZOLE MAGNESIUM 40 MG PO CPDR
40.0000 mg | DELAYED_RELEASE_CAPSULE | Freq: Every day | ORAL | 1 refills | Status: DC
  Filled 2021-04-15 – 2021-09-27 (×2): qty 90, 90d supply, fill #0

## 2021-04-15 MED ORDER — SERTRALINE HCL 100 MG PO TABS
100.0000 mg | ORAL_TABLET | Freq: Every day | ORAL | 1 refills | Status: DC
  Filled 2021-04-15 – 2021-05-30 (×2): qty 90, 90d supply, fill #0

## 2021-04-15 MED ORDER — MONTELUKAST SODIUM 10 MG PO TABS
10.0000 mg | ORAL_TABLET | Freq: Every day | ORAL | 1 refills | Status: DC
  Filled 2021-04-15 – 2021-09-27 (×2): qty 90, 90d supply, fill #0

## 2021-04-15 NOTE — Progress Notes (Signed)
BP 120/78   Pulse 85   Temp 98.7 F (37.1 C)   Ht '5\' 1"'$  (1.549 m)   Wt 231 lb 9.6 oz (105.1 kg)   LMP 03/10/2021   SpO2 100%   BMI 43.76 kg/m    Subjective:    Patient ID: [name], female    DOB: 12-06-1970, 50 y.o.   MRN: [id number]  HPI: [name] is a 50 y.o. female presenting on 04/15/2021 for comprehensive medical examination. Current medical complaints include:  GERD Currently taking Nexium 40 mg daily.  If she misses a dose, has significant acid reflux.  She knows her triggers and in general, tries to avoid them. Dysphagia: yes Odynophagia:  yes Hematemesis: yes Blood in stool: no EGD: no  DEPRESSION Reports mood is well controlled with sertraline 100 mg daily.  Mood status: controlled Satisfied with current treatment?: yes Symptom severity: mild  Duration of current treatment : chronic Side effects: no Medication compliance: excellent compliance Psychotherapy/counseling: no Depressed mood: yes; much improved with medication Anxious mood: no Anhedonia: no Significant weight loss or gain: no Insomnia: no Fatigue: yes Feelings of worthlessness or guilt: no Impaired concentration/indecisiveness: no Suicidal ideations: no Hopelessness: no Crying spells: no Depression screen [facility] 2/9 04/15/2021 07/07/2020 04/07/2020 11/18/2019 09/30/2019  Decreased Interest 1 0 0 0 0  Down, Depressed, Hopeless 0 0 1 1 0  PHQ - 2 Score 1 0 1 1 0  Altered sleeping 0 - - 3 -  Tired, decreased energy 1 - - 3 -  Change in appetite 0 - - 1 -  Feeling bad or failure about yourself  0 - - 1 -  Trouble concentrating 1 - - 1 -  Moving slowly or fidgety/restless 0 - - 0 -  Suicidal thoughts - - - 0 -  PHQ-9 Score 3 - - 10 -  Difficult doing work/chores Not difficult at all - - Somewhat difficult -    GAD 7 : Generalized Anxiety Score 04/15/2021 11/18/2019 07/17/2018  Nervous, Anxious, on Edge 0 3 3  Control/stop worrying 0 3 3  Worry too much - different things 0 3 3   Trouble relaxing 0 3 3  Restless 0 2 2  Easily annoyed or irritable 0 3 2  Afraid - awful might happen 0 2 0  Total GAD 7 Score 0 19 16  Anxiety Difficulty Not difficult at all Very difficult Very difficult   HYPOTHYROIDISM Currently taking levothyroxine 25 mcg daily on an empty stomach.  No issues with this medication Thyroid control status:controlled Satisfied with current treatment? yes Medication side effects: no Medication compliance: excellent compliance Recent dose adjustment:no Fatigue: no Cold intolerance: no Heat intolerance: no Weight gain: no Weight loss: no Constipation: no Diarrhea/loose stools: no Palpitations: no Lower extremity edema: no Anxiety/depressed mood: no  Earlier this year, she had ablation and D&C - bleeding has improved greatly.  Cycle has not come on yet.   The patient does not have a history of falls. I did not complete a risk assessment for falls. A plan of care for falls was not documented.   Past Medical History:  Past Medical History:  Diagnosis Date   Chronic allergic rhinitis    COVID 01-05-2021 result in epic   fatigue x 3 days   Elevated blood-pressure reading without diagnosis of hypertension    per pt being monitored by pcp, pt feels gets elevated in doctor office and with anxiety    (11-10-2019 cardiac CT showed normal coronaries  and cal score zero;  echo 10-20-2019  ef 55%,G1DD, mild LVH, mild MR)   GAD (generalized anxiety disorder)    GERD (gastroesophageal reflux disease)    Hypothyroidism    Iron deficiency anemia secondary to blood loss (chronic) hematology/ onocology--- dr Delton Coombes   secondary to uterine fibroids,  treated with oral iron and iron infusions   Menorrhagia    Panic attack    Tinnitus of left ear    chronic   Uterine fibroid    Wears glasses     Surgical History:  Past Surgical History:  Procedure Laterality Date   BREAST BIOPSY     CERVICAL CERCLAGE     CESAREAN SECTION     HYSTEROSCOPY WITH  RESECTOSCOPE N/A 02/11/2021   Procedure: HYSTEROSCOPY WITH MYOSURE;  Surgeon: Servando Salina, MD;  Location: [facility];  Service: Gynecology;  Laterality: N/A;    Medications:  Current Outpatient Medications on File Prior to Visit  Medication Sig   cetirizine (ZYRTEC) 10 MG tablet Take 10 mg by mouth daily at 12 noon.   Cholecalciferol (VITAMIN D3) 50 MCG (2000 UT) CHEW Chew 2 capsules by mouth daily.   ferrous sulfate 325 (65 FE) MG EC tablet Take 1 tablet (325 mg total) by mouth daily with breakfast.   ibuprofen (ADVIL) 800 MG tablet Take 1 tablet (800 mg total) by mouth every 8 (eight) hours as needed for moderate pain.   Lactobacillus Rhamnosus, GG, (CULTURELLE PO) Take 1 tablet by mouth daily.   levothyroxine (SYNTHROID) 25 MCG tablet TAKE 1 TABLET (25 MCG TOTAL) BY MOUTH DAILY. (Patient taking differently: Take 25 mcg by mouth daily before breakfast.)   Multiple Vitamins-Iron (MULTIVITAMINS WITH IRON) TABS tablet Take 1 tablet by mouth daily.   naproxen (NAPROSYN) 500 MG tablet Take 1 tablet (500 mg total) by mouth 2 (two) times daily with a meal. During menses (Patient taking differently: Take 500 mg by mouth 2 (two) times daily as needed. During menses)   OVER THE COUNTER MEDICATION 2 beets gummies daily   vitamin B-12 (CYANOCOBALAMIN) 1000 MCG tablet Take 1,000 mcg by mouth 2 (two) times daily.   No current facility-administered medications on file prior to visit.    Allergies:  No Known Allergies  Social History:  Social History   Socioeconomic History   Marital status: Married    Spouse name: Not on file   Number of children: 0   Years of education: Not on file   Highest education level: Not on file  Occupational History    Employer: [employer]  Tobacco Use   Smoking status: Never   Smokeless tobacco: Never  Vaping Use   Vaping Use: Never used  Substance and Sexual Activity   Alcohol use: Yes    Comment: occasional   Drug use: Never   Sexual  activity: Yes    Birth control/protection: Condom  Other Topics Concern   Not on file  Social History Narrative   Not on file   Social Determinants of Health   Financial Resource Strain: Low Risk    Difficulty of Paying Living Expenses: Not hard at all  Food Insecurity: No Food Insecurity   Worried About Charity fundraiser in the Last Year: Never true   Man[place] in the Last Year: Never true  Transportation Needs: No Transportation Needs   Lack of Transportation (Medical): No   Lack of Transportation (Non-Medical): No  Physical Activity: Sufficiently Active   Days of Exercise per Week:  3 days   Minutes of Exercise per Session: 120 min  Stress: No Stress Concern Present   Feeling of Stress : Only a little  Social Connections: Engineer, building services of Communication with Friends and Family: More than three times a week   Frequency of Social Gatherings with Friends and Family: Twice a week   Attends Religious Services: More than 4 times per year   Active Member of [employer] or Organizations: Yes   Attends Music therapist: More than 4 times per year   Marital Status: Married  Human resources officer Violence: Not At Risk   Fear of Current or Ex-Partner: No   Emotionally Abused: No   Physically Abused: No   Sexually Abused: No   Social History   Tobacco Use  Smoking Status Never  Smokeless Tobacco Never   Social History   Substance and Sexual Activity  Alcohol Use Yes   Comment: occasional    Family History:  Family History  Problem Relation Age of Onset   Arthritis Mother    Hypertension Mother    Hyperlipidemia Father    Hypertension Father    Cancer Maternal Aunt        breast   Breast cancer Maternal Aunt 69   Cancer Paternal Aunt        breast   Breast cancer Paternal Aunt 65   Arthritis Maternal Grandmother    Stroke Maternal Grandfather    Diabetes Paternal Grandmother    Heart disease Paternal Grandfather    Breast cancer Paternal  Aunt 40    Past medical history, surgical history, medications, allergies, family history and social history reviewed with patient today and changes made to appropriate areas of the chart.   Review of Systems  Constitutional: Negative.   HENT: Negative.    Eyes: Negative.   Respiratory: Negative.    Cardiovascular: Negative.   Gastrointestinal: Negative.   Genitourinary: Negative.   Musculoskeletal: Negative.   Skin: Negative.   Neurological: Negative.   Psychiatric/Behavioral: Negative.        Objective:    BP 120/78   Pulse 85   Temp 98.7 F (37.1 C)   Ht '5\' 1"'$  (1.549 m)   Wt 231 lb 9.6 oz (105.1 kg)   LMP 03/10/2021   SpO2 100%   BMI 43.76 kg/m   Wt Readings from Last 3 Encounters:  04/15/21 231 lb 9.6 oz (105.1 kg)  02/11/21 233 lb 14.4 oz (106.1 kg)  01/03/21 231 lb 9.6 oz (105.1 kg)    Physical Exam Vitals and nursing note reviewed.  Constitutional:      General: She is not in acute distress.    Appearance: Normal appearance. She is obese. She is not ill-appearing or toxic-appearing.  HENT:     Head: Normocephalic and atraumatic.     Right Ear: Tympanic membrane, ear canal and external ear normal.     Left Ear: Tympanic membrane, ear canal and external ear normal.     Nose: Nose normal. No congestion or rhinorrhea.     Mouth/Throat:     Mouth: Mucous membranes are moist.     Pharynx: Oropharynx is clear. No oropharyngeal exudate.  Eyes:     General: No scleral icterus.    Extraocular Movements: Extraocular movements intact.     Pupils: Pupils are equal, round, and reactive to light.  Cardiovascular:     Rate and Rhythm: Normal rate and regular rhythm.     Pulses: Normal pulses.  Heart sounds: Normal heart sounds. No murmur heard. Pulmonary:     Effort: Pulmonary effort is normal. No respiratory distress.     Breath sounds: No wheezing or rhonchi.  Abdominal:     General: Abdomen is flat. Bowel sounds are normal. There is no distension.      Palpations: Abdomen is soft.     Tenderness: There is no abdominal tenderness.  Musculoskeletal:        General: No swelling or tenderness. Normal range of motion.     Cervical back: Normal range of motion and neck supple. No rigidity or tenderness.     Right lower leg: No edema.     Left lower leg: No edema.  Skin:    General: Skin is warm and dry.     Capillary Refill: Capillary refill takes less than 2 seconds.     Coloration: Skin is not jaundiced or pale.  Neurological:     General: No focal deficit present.     Mental Status: She is alert and oriented to person, place, and time.     Motor: No weakness.     Gait: Gait normal.  Psychiatric:        Mood and Affect: Mood normal.        Behavior: Behavior normal.        Thought Content: Thought content normal.        Judgment: Judgment normal.      Assessment & Plan:   Problem List Items Addressed This Visit       Respiratory   Chronic allergic rhinitis    Chronic.  Stable with Zyrtec, Singulair, Atrovent.  Return to clinic in 6 months or sooner if needed.       Relevant Medications   fluticasone (FLONASE) 50 MCG/ACT nasal spray   ipratropium (ATROVENT) 0.03 % nasal spray   montelukast (SINGULAIR) 10 MG tablet     Digestive   Gastroesophageal reflux disease    Chronic, stable with Nexium 40 mg daily.  Continue trying to avoid triggers.  Continue Nexium for now.  Will check CBC and MG today.  Follow up in 6 months.       Relevant Medications   esomeprazole (NEXIUM) 40 MG capsule   Other Relevant Orders   COMPLETE METABOLIC PANEL WITH GFR   Magnesium     Endocrine   Hypothyroidism    Chronic,  Will check TSH today and adjust levothyroxine as indicated.  Follow up in 6 months or sooner if needed.       Relevant Orders   TSH     Other   Situational depression    Chronic, stable.  PHQ-9 not elevated today and no SI/HI.  Will continue sertraline 100 mg daily and follow up in 6 months.       Relevant  Medications   sertraline (ZOLOFT) 100 MG tablet   Other Relevant Orders   CBC with Differential/Platelet   GAD (generalized anxiety disorder)    Chronic.  GAD-7 not elevated today.  Continue sertraline 100 mg daily for now.  Follow up in 6 months or sooner if needed.       Relevant Medications   sertraline (ZOLOFT) 100 MG tablet   Other Relevant Orders   CBC with Differential/Platelet   Other Visit Diagnoses     Annual physical exam    -  Primary   Screening for cholesterol level       Relevant Orders   Lipid panel   Screening for diabetes  mellitus       Relevant Orders   Hemoglobin A1c        Follow up plan: Return in about 6 months (around 10/16/2021) for mood, gerd, allergic rhinitis follow up.   LABORATORY TESTING:  - Pap smear: up to date  IMMUNIZATIONS:   - Tdap: Tetanus vaccination status reviewed: last tetanus booster within 10 years. - Influenza: Postponed to flu season - Pneumovax: Not applicable - Prevnar: Not applicable - HPV: Not applicable - Zostavax vaccine: Not applicable - [id number] vaccine: Has had vaccine and 1 booster  SCREENING: -Mammogram: Ordered today  - Colonoscopy: Ordered today  - Bone Density: not applicable -Hearing Test: Not applicable  -Spirometry: Not applicable   PATIENT COUNSELING:   Advised to take 1 mg of folate supplement per day if capable of pregnancy.   Sexuality: Discussed sexually transmitted diseases, partner selection, use of condoms, avoidance of unintended pregnancy  and contraceptive alternatives.   Advised to avoid cigarette smoking.  I discussed with the patient that most people either abstain from alcohol or drink within safe limits (<=14/week and <=4 drinks/occasion for males, <=7/weeks and <= 3 drinks/occasion for females) and that the risk for alcohol disorders and other health effects rises proportionally with the number of drinks per week and how often a drinker exceeds daily limits.  Discussed  cessation/primary prevention of drug use and availability of treatment for abuse.   Diet: Encouraged to adjust caloric intake to maintain  or achieve ideal body weight, to reduce intake of dietary saturated fat and total fat, to limit sodium intake by avoiding high sodium foods and not adding table salt, and to maintain adequate dietary potassium and calcium preferably from fresh fruits, vegetables, and low-fat dairy products.    stressed the importance of regular exercise  Injury prevention: Discussed safety belts, safety helmets, smoke detector, smoking near bedding or upholstery.   Dental health: Discussed importance of regular tooth brushing, flossing, and dental visits.    NEXT PREVENTATIVE PHYSICAL DUE IN 1 YEAR. Return in about 6 months (around 10/16/2021) for mood, gerd, allergic rhinitis follow up.

## 2021-04-16 NOTE — Assessment & Plan Note (Signed)
Chronic,  Will check TSH today and adjust levothyroxine as indicated.  Follow up in 6 months or sooner if needed.

## 2021-04-16 NOTE — Assessment & Plan Note (Signed)
Chronic.  Stable with Zyrtec, Singulair, Atrovent.  Return to clinic in 6 months or sooner if needed.

## 2021-04-16 NOTE — Assessment & Plan Note (Signed)
Chronic, stable.  PHQ-9 not elevated today and no SI/HI.  Will continue sertraline 100 mg daily and follow up in 6 months.

## 2021-04-16 NOTE — Assessment & Plan Note (Signed)
Chronic.  GAD-7 not elevated today.  Continue sertraline 100 mg daily for now.  Follow up in 6 months or sooner if needed.

## 2021-04-16 NOTE — Assessment & Plan Note (Signed)
Chronic, stable with Nexium 40 mg daily.  Continue trying to avoid triggers.  Continue Nexium for now.  Will check CBC and MG today.  Follow up in 6 months.

## 2021-04-18 ENCOUNTER — Other Ambulatory Visit: Payer: No Typology Code available for payment source

## 2021-04-19 ENCOUNTER — Other Ambulatory Visit: Payer: No Typology Code available for payment source

## 2021-04-25 ENCOUNTER — Other Ambulatory Visit (HOSPITAL_COMMUNITY): Payer: Self-pay

## 2021-05-13 ENCOUNTER — Inpatient Hospital Stay (HOSPITAL_COMMUNITY): Payer: No Typology Code available for payment source

## 2021-05-18 ENCOUNTER — Ambulatory Visit (HOSPITAL_COMMUNITY): Payer: No Typology Code available for payment source | Admitting: Physician Assistant

## 2021-05-19 ENCOUNTER — Other Ambulatory Visit: Payer: Self-pay

## 2021-05-19 ENCOUNTER — Inpatient Hospital Stay (HOSPITAL_COMMUNITY): Payer: No Typology Code available for payment source | Attending: Hematology

## 2021-05-19 DIAGNOSIS — D500 Iron deficiency anemia secondary to blood loss (chronic): Secondary | ICD-10-CM | POA: Diagnosis present

## 2021-05-19 DIAGNOSIS — K219 Gastro-esophageal reflux disease without esophagitis: Secondary | ICD-10-CM

## 2021-05-19 DIAGNOSIS — F411 Generalized anxiety disorder: Secondary | ICD-10-CM

## 2021-05-19 DIAGNOSIS — K760 Fatty (change of) liver, not elsewhere classified: Secondary | ICD-10-CM | POA: Insufficient documentation

## 2021-05-19 DIAGNOSIS — D259 Leiomyoma of uterus, unspecified: Secondary | ICD-10-CM | POA: Diagnosis not present

## 2021-05-19 DIAGNOSIS — N920 Excessive and frequent menstruation with regular cycle: Secondary | ICD-10-CM | POA: Insufficient documentation

## 2021-05-19 DIAGNOSIS — F4321 Adjustment disorder with depressed mood: Secondary | ICD-10-CM

## 2021-05-19 LAB — IRON AND TIBC
Iron: 34 ug/dL (ref 28–170)
Saturation Ratios: 8 % — ABNORMAL LOW (ref 10.4–31.8)
TIBC: 427 ug/dL (ref 250–450)
UIBC: 393 ug/dL

## 2021-05-29 NOTE — Progress Notes (Deleted)
NO SHOW

## 2021-05-30 ENCOUNTER — Other Ambulatory Visit (HOSPITAL_COMMUNITY): Payer: Self-pay | Admitting: *Deleted

## 2021-05-30 ENCOUNTER — Other Ambulatory Visit (HOSPITAL_COMMUNITY): Payer: No Typology Code available for payment source

## 2021-05-30 ENCOUNTER — Inpatient Hospital Stay (HOSPITAL_COMMUNITY): Payer: No Typology Code available for payment source | Admitting: Physician Assistant

## 2021-05-30 ENCOUNTER — Other Ambulatory Visit (HOSPITAL_COMMUNITY): Payer: Self-pay

## 2021-05-30 DIAGNOSIS — D500 Iron deficiency anemia secondary to blood loss (chronic): Secondary | ICD-10-CM

## 2021-06-15 NOTE — Progress Notes (Signed)
Virtual Visit via Telephone Note [facility]  I connected with [name]  on 06/16/2021 at 3:55 PM by telephone and verified that I am speaking with the correct person using two identifiers.  Location: Patient: Home Provider: [facility]   I discussed the limitations, risks, security and privacy concerns of performing an evaluation and management service by telephone and the availability of in person appointments. I also discussed with the patient that there may be a patient responsible charge related to this service. The patient expressed understanding and agreed to proceed.   HISTORY OF PRESENT ILLNESS: [name] 50 y.o. female returns for routine follow-up of her iron deficiency anemia.  She was last seen by Dr. Delton Coombes on 01/03/2021.  At today's visit, she reports feeling fairly well.  No recent hospitalizations, surgeries, or changes in baseline health status.  She had ablation and D&C for fibroids to help minimize bleeding in April 2022, reports that her menstrual cycles have been much lighter since that time.  She denies any other source of blood loss such as epistaxis, hematemesis, hematochezia, or melena.  She reports that she feels very slightly more tired than usual, but denies any severe fatigue.  No pica symptoms.  No chest pain, dyspnea on exertion, dizziness, syncope.  She reports that she stopped taking oral iron supplement due to constipation and acid reflux.  She has 90% energy and 100% appetite. She endorses that she is maintaining a stable weight.    OBSERVATIONS/OBJECTIVE: Review of Systems  Constitutional:  Negative for chills, diaphoresis, fever, malaise/fatigue and weight loss.  Respiratory:  Negative for cough and shortness of breath.   Cardiovascular:  Negative for chest pain and palpitations.  Gastrointestinal:  Positive for nausea and vomiting. Negative for abdominal pain, blood in stool and melena.  Neurological:   Positive for tingling. Negative for dizziness and headaches.    PHYSICAL EXAM (per limitations of virtual telephone visit): The patient is alert and oriented x 3, exhibiting adequate mentation, good mood, and ability to speak in full sentences and execute sound judgement.   ASSESSMENT & PLAN: 1.  Iron deficiency anemia due to uterine fibroid: - She had D&C for fibroids to help minimize bleeding in April 2022, reports that her menstrual periods have been much lighter since that time. - No bright red blood per rectum or melena. - Received Venofer 300 mg x3 doses from 08/24/2020 through 09/07/2020 with improvement in energy levels. - She recently stopped taking iron pill due to constipation and acid reflux. - She has started to feel mildly fatigued lately, but is still able to be very active in her life. - Most recent labs (06/16/2021): Normal Hgb 13.0 but evidence of iron deficiency with ferritin 17 - She works in Conservator, museum/gallery at [employer]. - PLAN: We will schedule patient for IV Venofer x3 (300-300-400).  Recommended to her that she restart her oral iron supplement with addition of stool softener to see if she tolerates it better.  Repeat labs and phone visit in 4 months.  2.  Fatty liver: - Ultrasound of abdomen on 10/09/2019 shows diffuse increased liver echogenicity, indicative of hepatic steatosis. - Discussed risk complications of fatty liver.  Patient will try start losing weight after the GYN procedure.   FOLLOW UP INSTRUCTIONS: - IV Venofer x3 (300-300-400) - Labs and phone visit in 4 months   I discussed the assessment and treatment plan with the patient. The patient was provided an opportunity to ask questions  and all were answered. The patient agreed with the plan and demonstrated an understanding of the instructions.   The patient was advised to call back or seek an in-person evaluation if the symptoms worsen or if the condition fails to improve as anticipated.  I provided 15  minutes of non-face-to-face time during this encounter.   Harriett Rush, PA-C 06/16/2021 4:39 PM

## 2021-06-16 ENCOUNTER — Inpatient Hospital Stay (HOSPITAL_COMMUNITY): Payer: No Typology Code available for payment source | Attending: Hematology | Admitting: Physician Assistant

## 2021-06-16 ENCOUNTER — Inpatient Hospital Stay (HOSPITAL_COMMUNITY): Payer: No Typology Code available for payment source | Admitting: Physician Assistant

## 2021-06-16 ENCOUNTER — Inpatient Hospital Stay (HOSPITAL_COMMUNITY): Payer: No Typology Code available for payment source | Attending: Physician Assistant

## 2021-06-16 DIAGNOSIS — D500 Iron deficiency anemia secondary to blood loss (chronic): Secondary | ICD-10-CM | POA: Insufficient documentation

## 2021-06-16 DIAGNOSIS — D259 Leiomyoma of uterus, unspecified: Secondary | ICD-10-CM | POA: Diagnosis not present

## 2021-06-16 DIAGNOSIS — D508 Other iron deficiency anemias: Secondary | ICD-10-CM

## 2021-06-16 DIAGNOSIS — R202 Paresthesia of skin: Secondary | ICD-10-CM

## 2021-06-16 LAB — COMPREHENSIVE METABOLIC PANEL
ALT: 19 U/L (ref 0–44)
AST: 16 U/L (ref 15–41)
Albumin: 4 g/dL (ref 3.5–5.0)
Alkaline Phosphatase: 67 U/L (ref 38–126)
Anion gap: 5 (ref 5–15)
BUN: 11 mg/dL (ref 6–20)
CO2: 25 mmol/L (ref 22–32)
Calcium: 8.9 mg/dL (ref 8.9–10.3)
Chloride: 106 mmol/L (ref 98–111)
Creatinine, Ser: 0.81 mg/dL (ref 0.44–1.00)
GFR, Estimated: >60 mL/min (ref >60)
Glucose, Bld: 107 mg/dL — ABNORMAL HIGH (ref 70–99)
Potassium: 4 mmol/L (ref 3.5–5.1)
Sodium: 136 mmol/L (ref 135–145)
Total Bilirubin: 0.8 mg/dL (ref 0.3–1.2)
Total Protein: 7.6 g/dL (ref 6.5–8.1)

## 2021-06-16 LAB — CBC WITH DIFFERENTIAL/PLATELET
Abs Immature Granulocytes: 0.01 10*3/uL (ref 0.00–0.07)
Basophils Absolute: 0.1 10*3/uL (ref 0.0–0.1)
Basophils Relative: 1 %
Eosinophils Absolute: 0.2 10*3/uL (ref 0.0–0.5)
Eosinophils Relative: 3 %
HCT: 39.5 % (ref 36.0–46.0)
Hemoglobin: 13 g/dL (ref 12.0–15.0)
Immature Granulocytes: 0 %
Lymphocytes Relative: 39 %
Lymphs Abs: 2.6 10*3/uL (ref 0.7–4.0)
MCH: 29.5 pg (ref 26.0–34.0)
MCHC: 32.9 g/dL (ref 30.0–36.0)
MCV: 89.8 fL (ref 80.0–100.0)
Monocytes Absolute: 0.6 10*3/uL (ref 0.1–1.0)
Monocytes Relative: 9 %
Neutro Abs: 3.2 10*3/uL (ref 1.7–7.7)
Neutrophils Relative %: 48 %
Platelets: 311 10*3/uL (ref 150–400)
RBC: 4.4 MIL/uL (ref 3.87–5.11)
RDW: 14 % (ref 11.5–15.5)
WBC: 6.7 10*3/uL (ref 4.0–10.5)
nRBC: 0 % (ref 0.0–0.2)

## 2021-06-16 LAB — FERRITIN: Ferritin: 17 ng/mL (ref 11–307)

## 2021-07-01 ENCOUNTER — Telehealth: Payer: Self-pay

## 2021-07-01 NOTE — Telephone Encounter (Signed)
Blood work from cancer center reviewed.  The blood work I ordered on 04/15/21 is what she needs for me.  There are no duplicates.

## 2021-07-01 NOTE — Telephone Encounter (Signed)
Pt called in stating that she has some lab work done at the cancer center, and the results were back. Pt stated that she wasn't sure what NP needed from lab work, but wanted to know if she needed to do any more labs. Please advise.   Cb#: [phone]

## 2021-07-01 NOTE — Telephone Encounter (Signed)
Call placed to patient and patient made aware.  

## 2021-07-04 ENCOUNTER — Ambulatory Visit (INDEPENDENT_AMBULATORY_CARE_PROVIDER_SITE_OTHER): Payer: No Typology Code available for payment source | Admitting: *Deleted

## 2021-07-04 ENCOUNTER — Other Ambulatory Visit: Payer: No Typology Code available for payment source

## 2021-07-04 ENCOUNTER — Other Ambulatory Visit: Payer: Self-pay

## 2021-07-04 ENCOUNTER — Inpatient Hospital Stay (HOSPITAL_COMMUNITY): Payer: No Typology Code available for payment source

## 2021-07-04 ENCOUNTER — Encounter (HOSPITAL_COMMUNITY): Payer: Self-pay

## 2021-07-04 VITALS — BP 144/79 | HR 73 | Temp 97.6°F | Resp 18

## 2021-07-04 DIAGNOSIS — Z131 Encounter for screening for diabetes mellitus: Secondary | ICD-10-CM

## 2021-07-04 DIAGNOSIS — K760 Fatty (change of) liver, not elsewhere classified: Secondary | ICD-10-CM | POA: Insufficient documentation

## 2021-07-04 DIAGNOSIS — Z23 Encounter for immunization: Secondary | ICD-10-CM

## 2021-07-04 DIAGNOSIS — Z79899 Other long term (current) drug therapy: Secondary | ICD-10-CM | POA: Insufficient documentation

## 2021-07-04 DIAGNOSIS — D508 Other iron deficiency anemias: Secondary | ICD-10-CM | POA: Diagnosis present

## 2021-07-04 DIAGNOSIS — D259 Leiomyoma of uterus, unspecified: Secondary | ICD-10-CM | POA: Diagnosis not present

## 2021-07-04 DIAGNOSIS — Z13220 Encounter for screening for lipoid disorders: Secondary | ICD-10-CM

## 2021-07-04 DIAGNOSIS — K219 Gastro-esophageal reflux disease without esophagitis: Secondary | ICD-10-CM

## 2021-07-04 DIAGNOSIS — E039 Hypothyroidism, unspecified: Secondary | ICD-10-CM

## 2021-07-04 MED ORDER — SODIUM CHLORIDE 0.9 % IV SOLN: Freq: Once | INTRAVENOUS | Status: AC

## 2021-07-04 MED ORDER — SODIUM CHLORIDE 0.9 % IV SOLN
300.0000 mg | Freq: Once | INTRAVENOUS | Status: AC
  Administered 2021-07-04: 300 mg via INTRAVENOUS
  Filled 2021-07-04: qty 300

## 2021-07-04 NOTE — Patient Instructions (Signed)
[facility]  Discharge Instructions: °Thank you for choosing [facility] to provide your oncology and hematology care.  °If you have a lab appointment with the Cancer Center, please come in thru the Main Entrance and check in at the main information desk. ° °Wear comfortable clothing and clothing appropriate for easy access to any Portacath or PICC line.  ° °We strive to give you quality time with your provider. You may need to reschedule your appointment if you arrive late (15 or more minutes).  Arriving late affects you and other patients whose appointments are after yours.  Also, if you miss three or more appointments without notifying the office, you may be dismissed from the clinic at the provider’s discretion.    °  °For prescription refill requests, have your pharmacy contact our office and allow 72 hours for refills to be completed.   ° °Today you received the following : Venofer 300 mg.     °  °To help prevent nausea and vomiting after your treatment, we encourage you to take your nausea medication as directed. ° °BELOW ARE SYMPTOMS THAT SHOULD BE REPORTED IMMEDIATELY: °*FEVER GREATER THAN 100.4 F (38 °C) OR HIGHER °*CHILLS OR SWEATING °*NAUSEA AND VOMITING THAT IS NOT CONTROLLED WITH YOUR NAUSEA MEDICATION °*UNUSUAL SHORTNESS OF BREATH °*UNUSUAL BRUISING OR BLEEDING °*URINARY PROBLEMS (pain or burning when urinating, or frequent urination) °*BOWEL PROBLEMS (unusual diarrhea, constipation, pain near the anus) °TENDERNESS IN MOUTH AND THROAT WITH OR WITHOUT PRESENCE OF ULCERS (sore throat, sores in mouth, or a toothache) °UNUSUAL RASH, SWELLING OR PAIN  °UNUSUAL VAGINAL DISCHARGE OR ITCHING  ° °Items with * indicate a potential emergency and should be followed up as soon as possible or go to the Emergency Department if any problems should occur. ° °Please show the CHEMOTHERAPY ALERT CARD or IMMUNOTHERAPY ALERT CARD at check-in to the Emergency Department and triage nurse. ° °Should  you have questions after your visit or need to cancel or reschedule your appointment, please contact [facility] [phone]  and follow the prompts.  Office hours are 8:00 a.m. to 4:30 p.m. Monday - Friday. Please note that voicemails left after 4:00 p.m. may not be returned until the following business day.  We are closed weekends and major holidays. You have access to a nurse at all times for urgent questions. Please call the main number to the clinic [phone] and follow the prompts. ° °For any non-urgent questions, you may also contact your provider using MyChart. We now offer e-Visits for anyone 18 and older to request care online for non-urgent symptoms. For details visit [email or web address]. °  °Also download the MyChart app! Go to the app store, search "MyChart", open the app, select , and log in with your MyChart username and password. ° °Due to Covid, a mask is required upon entering the hospital/clinic. If you do not have a mask, one will be given to you upon arrival. For doctor visits, patients may have 1 support person aged 18 or older with them. For treatment visits, patients cannot have anyone with them due to current Covid guidelines and our immunocompromised population.  °

## 2021-07-04 NOTE — Progress Notes (Signed)
Patient presents today for Venofer 300 mgs IV iron infusion. Vital signs are stable. Patient denies any changes since the last iron infusion. Patient denies any complaints today. MAR reviewed and updated.    Venofer 300 mg IV given today per MD orders. Tolerated infusion without adverse affects. Vital signs stable. No complaints at this time. Discharged from clinic ambulatory in stable condition. Alert and oriented x 3. F/U with [facility] as scheduled.

## 2021-07-05 LAB — HEMOGLOBIN A1C
Hgb A1c MFr Bld: 5.1 % of total Hgb (ref <5.7)
Mean Plasma Glucose: 100 mg/dL
eAG (mmol/L): 5.5 mmol/L

## 2021-07-05 LAB — LIPID PANEL
Cholesterol: 171 mg/dL (ref <200)
HDL: 65 mg/dL (ref >=50)
LDL Cholesterol (Calc): 91 mg/dL (calc)
Non-HDL Cholesterol (Calc): 106 mg/dL (calc) (ref <130)
Total CHOL/HDL Ratio: 2.6 (calc) (ref <5.0)
Triglycerides: 64 mg/dL (ref <150)

## 2021-07-05 LAB — TSH: TSH: 1.96 mIU/L

## 2021-07-05 LAB — MAGNESIUM: Magnesium: 2 mg/dL (ref 1.5–2.5)

## 2021-07-11 ENCOUNTER — Inpatient Hospital Stay (HOSPITAL_COMMUNITY): Payer: No Typology Code available for payment source

## 2021-07-14 ENCOUNTER — Encounter: Payer: Self-pay | Admitting: Nurse Practitioner

## 2021-07-18 ENCOUNTER — Other Ambulatory Visit: Payer: Self-pay

## 2021-07-18 ENCOUNTER — Inpatient Hospital Stay (HOSPITAL_COMMUNITY): Payer: No Typology Code available for payment source | Attending: Hematology

## 2021-07-18 VITALS — BP 134/68 | HR 80 | Temp 96.9°F | Resp 18

## 2021-07-18 DIAGNOSIS — D259 Leiomyoma of uterus, unspecified: Secondary | ICD-10-CM | POA: Diagnosis not present

## 2021-07-18 DIAGNOSIS — D508 Other iron deficiency anemias: Secondary | ICD-10-CM

## 2021-07-18 DIAGNOSIS — D500 Iron deficiency anemia secondary to blood loss (chronic): Secondary | ICD-10-CM | POA: Diagnosis not present

## 2021-07-18 MED ORDER — SODIUM CHLORIDE 0.9 % IV SOLN
300.0000 mg | Freq: Once | INTRAVENOUS | Status: AC
  Administered 2021-07-18: 300 mg via INTRAVENOUS
  Filled 2021-07-18: qty 300

## 2021-07-18 MED ORDER — SODIUM CHLORIDE 0.9 % IV SOLN: INTRAVENOUS | Status: DC

## 2021-07-18 NOTE — Patient Instructions (Signed)
[facility]  Discharge Instructions: Thank you for choosing [facility] to provide your oncology and hematology care.  If you have a lab appointment with the Cancer Center, please come in thru the Main Entrance and check in at the main information desk.  Wear comfortable clothing and clothing appropriate for easy access to any Portacath or PICC line.   We strive to give you quality time with your provider. You may need to reschedule your appointment if you arrive late (15 or more minutes).  Arriving late affects you and other patients whose appointments are after yours.  Also, if you miss three or more appointments without notifying the office, you may be dismissed from the clinic at the provider's discretion.      For prescription refill requests, have your pharmacy contact our office and allow 72 hours for refills to be completed.        To help prevent nausea and vomiting after your treatment, we encourage you to take your nausea medication as directed.  BELOW ARE SYMPTOMS THAT SHOULD BE REPORTED IMMEDIATELY: *FEVER GREATER THAN 100.4 F (38 C) OR HIGHER *CHILLS OR SWEATING *NAUSEA AND VOMITING THAT IS NOT CONTROLLED WITH YOUR NAUSEA MEDICATION *UNUSUAL SHORTNESS OF BREATH *UNUSUAL BRUISING OR BLEEDING *URINARY PROBLEMS (pain or burning when urinating, or frequent urination) *BOWEL PROBLEMS (unusual diarrhea, constipation, pain near the anus) TENDERNESS IN MOUTH AND THROAT WITH OR WITHOUT PRESENCE OF ULCERS (sore throat, sores in mouth, or a toothache) UNUSUAL RASH, SWELLING OR PAIN  UNUSUAL VAGINAL DISCHARGE OR ITCHING   Items with * indicate a potential emergency and should be followed up as soon as possible or go to the Emergency Department if any problems should occur.  Please show the CHEMOTHERAPY ALERT CARD or IMMUNOTHERAPY ALERT CARD at check-in to the Emergency Department and triage nurse.  Should you have questions after your visit or need to cancel  or reschedule your appointment, please contact  CANCER CENTER [phone]  and follow the prompts.  Office hours are 8:00 a.m. to 4:30 p.m. Monday - Friday. Please note that voicemails left after 4:00 p.m. may not be returned until the following business day.  We are closed weekends and major holidays. You have access to a nurse at all times for urgent questions. Please call the main number to the clinic [phone] and follow the prompts.  For any non-urgent questions, you may also contact your provider using MyChart. We now offer e-Visits for anyone 18 and older to request care online for non-urgent symptoms. For details visit [email or web address].   Also download the MyChart app! Go to the app store, search "MyChart", open the app, select [facility], and log in with your MyChart username and password.  Due to Covid, a mask is required upon entering the hospital/clinic. If you do not have a mask, one will be given to you upon arrival. For doctor visits, patients may have 1 support person aged 18 or older with them. For treatment visits, patients cannot have anyone with them due to current Covid guidelines and our immunocompromised population.  

## 2021-07-18 NOTE — Progress Notes (Signed)
Patient presents today for iron infusion.  Patient is in satisfactory condition with no new complaints voiced.  Vital signs are stable.  We will proceed with treatment per MD orders.   Patient tolerated treatment well with no complaints voiced.  Patient left ambulatory in stable condition.  Vital signs stable at discharge.  Follow up as scheduled.    

## 2021-07-25 ENCOUNTER — Encounter: Payer: Self-pay | Admitting: Nurse Practitioner

## 2021-07-25 ENCOUNTER — Ambulatory Visit (INDEPENDENT_AMBULATORY_CARE_PROVIDER_SITE_OTHER): Payer: No Typology Code available for payment source | Admitting: Nurse Practitioner

## 2021-07-25 ENCOUNTER — Inpatient Hospital Stay (HOSPITAL_COMMUNITY): Payer: No Typology Code available for payment source

## 2021-07-25 ENCOUNTER — Other Ambulatory Visit: Payer: Self-pay

## 2021-07-25 ENCOUNTER — Other Ambulatory Visit (HOSPITAL_COMMUNITY): Payer: Self-pay

## 2021-07-25 MED ORDER — SAXENDA 18 MG/3ML ~~LOC~~ SOPN
PEN_INJECTOR | SUBCUTANEOUS | 1 refills | Status: AC
  Filled 2021-07-25 – 2021-08-11 (×2): qty 15, 21d supply, fill #0
  Filled 2021-08-16: qty 15, 30d supply, fill #0
  Filled 2022-06-15: qty 15, 21d supply, fill #0

## 2021-07-25 NOTE — Assessment & Plan Note (Addendum)
Start Saxenda for weight loss.  Discussed dosing today.  Sample given in office.  Also encouraged meal prepping, starting physical activity - long term goal is 30 minutes 5 times weekly but start slow.  Referral placed to medical weight management per patient request.  Follow up in 3 months.

## 2021-07-25 NOTE — Progress Notes (Signed)
Subjective:    Patient ID: [name], female    DOB: [birth year], 50 y.o.   MRN: [id number]  HPI: [name] is a 50 y.o. female presenting for obesity.  Chief Complaint  Patient presents with   Follow-up    Follow up weight    ELEVATED BMI: 44 Wants to work on lifestyle changes.  She really wants to get healthy and stay healthy. Duration: chronic Previous attempts at weight loss: yes Complications of obesity: GERD, borderline elevated blood pressure Peak weight: 235 lbs Weight loss goal: ~60 lbs or size 12 Requesting obesity pharmacotherapy: yes Current weight loss supplements/medications: none Previous weight loss supplements/meds: previously took [employer] intake: 1/2 gallon every other day  Physical activity: has a gym at home - has treadmill and resistance bands  No Known Allergies  Outpatient Encounter Medications as of 07/25/2021  Medication Sig   cetirizine (ZYRTEC) 10 MG tablet Take 10 mg by mouth daily at 12 noon.   Cholecalciferol (VITAMIN D3) 50 MCG (2000 UT) CHEW Chew 2 capsules by mouth daily.   esomeprazole (NEXIUM) 40 MG capsule Take 1 capsule (40 mg total) by mouth daily.   ferrous sulfate 325 (65 FE) MG EC tablet Take 1 tablet (325 mg total) by mouth daily with breakfast.   fluticasone (FLONASE) 50 MCG/ACT nasal spray Place 2 sprays into both nostrils daily.   ibuprofen (ADVIL) 800 MG tablet Take 1 tablet (800 mg total) by mouth every 8 (eight) hours as needed for moderate pain.   ipratropium (ATROVENT) 0.03 % nasal spray Place 2 sprays into both nostrils 2 (two) times daily.   Lactobacillus Rhamnosus, GG, (CULTURELLE PO) Take 1 tablet by mouth daily.   levothyroxine (SYNTHROID) 25 MCG tablet TAKE 1 TABLET (25 MCG TOTAL) BY MOUTH DAILY. (Patient taking differently: Take 25 mcg by mouth daily before breakfast.)   Liraglutide -Weight Management (SAXENDA) 18 MG/3ML SOPN Inject 1.8 mg into the skin daily for 7 days, THEN 2.4 mg daily for 7 days,  THEN 3 mg daily for 7 days.   montelukast (SINGULAIR) 10 MG tablet Take 1 tablet (10 mg total) by mouth at bedtime.   Multiple Vitamins-Iron (MULTIVITAMINS WITH IRON) TABS tablet Take 1 tablet by mouth daily.   naproxen (NAPROSYN) 500 MG tablet Take 1 tablet (500 mg total) by mouth 2 (two) times daily with a meal. During menses (Patient taking differently: Take 500 mg by mouth 2 (two) times daily as needed. During menses)   OVER THE COUNTER MEDICATION 2 beets gummies daily   sertraline (ZOLOFT) 100 MG tablet Take 1 tablet (100 mg total) by mouth at bedtime.   vitamin B-12 (CYANOCOBALAMIN) 1000 MCG tablet Take 1,000 mcg by mouth 2 (two) times daily.   No facility-administered encounter medications on file as of 07/25/2021.    Patient Active Problem List   Diagnosis Date Noted   Colon cancer screening 08/24/2020   Fatty liver 08/24/2020   Morbid obesity ([place]) 08/24/2020   GAD (generalized anxiety disorder) 11/18/2019   Situational depression 11/18/2019   Family history of early CAD 10/08/2019   Elevated blood pressure reading without diagnosis of hypertension 10/08/2019   Chest pain 10/08/2019   Iron deficiency anemia 09/30/2019   Peripheral edema 03/12/2019   Hypothyroidism 12/04/2016   Class 3 obesity ([place]) 12/04/2016   Gastroesophageal reflux disease 12/04/2016   Chronic allergic rhinitis 12/04/2016   Tinnitus 12/04/2016    Past Medical History:  Diagnosis Date   Chronic allergic rhinitis  COVID 01-05-2021 result in epic   fatigue x 3 days   Elevated blood-pressure reading without diagnosis of hypertension    per pt being monitored by pcp, pt feels gets elevated in doctor office and with anxiety    (11-10-2019 cardiac CT showed normal coronaries and cal score zero;  echo 10-20-2019  ef 55%,G1DD, mild LVH, mild MR)   GAD (generalized anxiety disorder)    GERD (gastroesophageal reflux disease)    Hypothyroidism    Iron deficiency anemia secondary to blood loss (chronic)  hematology/ onocology--- dr Delton Coombes   secondary to uterine fibroids,  treated with oral iron and iron infusions   Menorrhagia    Panic attack    Tinnitus of left ear    chronic   Uterine fibroid    Wears glasses     Relevant past medical, surgical, family and social history reviewed and updated as indicated. Interim medical history since our last visit reviewed.  Review of Systems Per HPI unless specifically indicated above     Objective:    BP 130/88   Pulse 73   Temp (!) 97.3 F (36.3 C)   Ht 5\' 1"  (1.549 m)   Wt 235 lb 3.2 oz (106.7 kg)   LMP 06/15/2021 (Approximate)   SpO2 99%   BMI 44.44 kg/m   Wt Readings from Last 3 Encounters:  07/25/21 235 lb 3.2 oz (106.7 kg)  04/15/21 231 lb 9.6 oz (105.1 kg)  02/11/21 233 lb 14.4 oz (106.1 kg)    Physical Exam Vitals and nursing note reviewed.  Constitutional:      General: She is not in acute distress.    Appearance: Normal appearance. She is obese. She is not toxic-appearing.  Eyes:     General: No scleral icterus.    Extraocular Movements: Extraocular movements intact.  Cardiovascular:     Rate and Rhythm: Normal rate and regular rhythm.  Pulmonary:     Effort: Pulmonary effort is normal. No respiratory distress.  Musculoskeletal:     Right lower leg: No edema.     Left lower leg: No edema.  Skin:    General: Skin is warm and dry.     Coloration: Skin is not jaundiced or pale.     Findings: No erythema.  Neurological:     Mental Status: She is alert and oriented to person, place, and time.     Motor: No weakness.     Gait: Gait normal.  Psychiatric:        Mood and Affect: Mood normal.        Behavior: Behavior normal.        Thought Content: Thought content normal.        Judgment: Judgment normal.      Assessment & Plan:   Problem List Items Addressed This Visit       Other   Morbid obesity ([place]) - Primary    Start Saxenda for weight loss.  Discussed dosing today.  Sample given in office.  Also  encouraged meal prepping, starting physical activity - long term goal is 30 minutes 5 times weekly but start slow.  Referral placed to medical weight management per patient request.  Follow up in 3 months.      Relevant Medications   Liraglutide -Weight Management (SAXENDA) 18 MG/3ML SOPN   Other Relevant Orders   Amb Ref to Medical Weight Management     Follow up plan: Return in about 3 months (around 10/25/2021) for weight loss follow up.

## 2021-07-27 ENCOUNTER — Telehealth: Payer: Self-pay | Admitting: *Deleted

## 2021-07-27 NOTE — Telephone Encounter (Signed)
Received request from pharmacy for [place] on Saxenda.   PA submitted.   Dx: E66.09- obesity.     04/15/2021 07/25/2021  Weight 231 lbs 235 lbs  BMI 43.78% 44.46%    The requested drug is being used as an adjunct to lifestyle modification (e.g., dietary or caloric restriction, exercise, behavioral support, community based program). The patient has engaged in > 36months of lifestyle modification and failed to achieve desired weight loss. The patient has BMI greater than or equal to 30 kilograms / square meter with additional disorder related to weight (high cholesterol, hypertension, diabetes, or sleep apnea).   Your PA has been faxed to the plan as a paper copy. Please contact the plan directly if you haven't received a determination in a typical timeframe.  You will be notified of the determination electronically and via fax.

## 2021-08-11 ENCOUNTER — Other Ambulatory Visit (HOSPITAL_COMMUNITY): Payer: Self-pay

## 2021-08-11 NOTE — Telephone Encounter (Signed)
Received fax request from [facility] asking for more information. Form completed and faxed back by front office.

## 2021-08-15 ENCOUNTER — Other Ambulatory Visit (HOSPITAL_COMMUNITY): Payer: Self-pay

## 2021-08-15 MED ORDER — PENICILLIN V POTASSIUM 500 MG PO TABS
ORAL_TABLET | ORAL | 0 refills | Status: DC
  Filled 2021-08-15: qty 32, 7d supply, fill #0

## 2021-08-16 ENCOUNTER — Other Ambulatory Visit (HOSPITAL_COMMUNITY): Payer: Self-pay

## 2021-08-17 ENCOUNTER — Other Ambulatory Visit (HOSPITAL_COMMUNITY): Payer: Self-pay

## 2021-08-24 NOTE — Telephone Encounter (Signed)
(  Key: [id number]) Saxenda 18MG /3ML pen-injectors   Form Med[employer] ePA Form 2017 NCPDP Created 5 minutes ago Sent to Plan 1 minute ago Plan Response less than a minute ago Submit Clinical Questions Determination Message from Plan Member has an active PA on file which is expiring on 12/12/2021 and has 4 no. of fills remaining.

## 2021-08-30 ENCOUNTER — Other Ambulatory Visit (HOSPITAL_COMMUNITY): Payer: Self-pay

## 2021-08-30 MED ORDER — IBUPROFEN 800 MG PO TABS
800.0000 mg | ORAL_TABLET | Freq: Four times a day (QID) | ORAL | 1 refills | Status: DC | PRN
  Filled 2021-08-30: qty 30, 8d supply, fill #0

## 2021-08-30 MED ORDER — AMOXICILLIN-POT CLAVULANATE 875-125 MG PO TABS
1.0000 | ORAL_TABLET | Freq: Two times a day (BID) | ORAL | 1 refills | Status: DC
  Filled 2021-08-30: qty 14, 7d supply, fill #0
  Filled 2021-10-31: qty 14, 7d supply, fill #1

## 2021-08-30 MED ORDER — CHLORHEXIDINE GLUCONATE 0.12 % MT SOLN
OROMUCOSAL | 0 refills | Status: DC
  Filled 2021-08-30: qty 473, 17d supply, fill #0

## 2021-09-27 ENCOUNTER — Other Ambulatory Visit: Payer: Self-pay | Admitting: Nurse Practitioner

## 2021-09-27 ENCOUNTER — Other Ambulatory Visit (HOSPITAL_COMMUNITY): Payer: Self-pay

## 2021-09-28 ENCOUNTER — Other Ambulatory Visit (HOSPITAL_COMMUNITY): Payer: Self-pay

## 2021-09-28 MED ORDER — LEVOTHYROXINE SODIUM 25 MCG PO TABS
25.0000 ug | ORAL_TABLET | Freq: Every day | ORAL | 1 refills | Status: DC
Start: 2021-09-28 — End: 2022-10-27
  Filled 2021-09-28: qty 90, 90d supply, fill #0
  Filled 2022-03-28: qty 90, 90d supply, fill #1

## 2021-10-17 ENCOUNTER — Inpatient Hospital Stay (HOSPITAL_COMMUNITY): Payer: No Typology Code available for payment source

## 2021-10-24 ENCOUNTER — Telehealth (HOSPITAL_COMMUNITY): Payer: No Typology Code available for payment source | Admitting: Physician Assistant

## 2021-10-26 ENCOUNTER — Ambulatory Visit: Payer: No Typology Code available for payment source | Admitting: Nurse Practitioner

## 2021-10-31 ENCOUNTER — Other Ambulatory Visit (HOSPITAL_COMMUNITY): Payer: Self-pay

## 2021-11-08 ENCOUNTER — Encounter: Payer: Self-pay | Admitting: Nurse Practitioner

## 2021-11-28 ENCOUNTER — Other Ambulatory Visit: Payer: Self-pay

## 2021-11-28 ENCOUNTER — Encounter: Payer: Self-pay | Admitting: Nurse Practitioner

## 2021-11-28 ENCOUNTER — Ambulatory Visit (INDEPENDENT_AMBULATORY_CARE_PROVIDER_SITE_OTHER): Payer: No Typology Code available for payment source | Admitting: Nurse Practitioner

## 2021-11-28 VITALS — BP 132/71 | HR 82 | Ht 61.0 in | Wt 239.8 lb

## 2021-11-28 DIAGNOSIS — E039 Hypothyroidism, unspecified: Secondary | ICD-10-CM

## 2021-11-28 DIAGNOSIS — D509 Iron deficiency anemia, unspecified: Secondary | ICD-10-CM | POA: Diagnosis not present

## 2021-11-28 DIAGNOSIS — Z7689 Persons encountering health services in other specified circumstances: Secondary | ICD-10-CM | POA: Diagnosis not present

## 2021-11-28 DIAGNOSIS — J329 Chronic sinusitis, unspecified: Secondary | ICD-10-CM

## 2021-11-28 DIAGNOSIS — K760 Fatty (change of) liver, not elsewhere classified: Secondary | ICD-10-CM

## 2021-11-28 NOTE — Progress Notes (Signed)
? ?  Subjective:  ? ? Patient ID: [name], female    DOB: 06/18/1971, 50 y.o.   MRN: [id number] ? ?HPI ? ?Patient arrives to establish care. ? ?Patient states that she discuss Saxenda in great detail with previous providers and currently has a prescription at home.  Patient states that she has been reluctant to start the Saxenda prescription due to side effects.  Patient states that she has made some lifestyle modifications by joining weight watchers and exercising.   ? ?Patient states that she has history of anemia secondary to history of fibroids.  Patient states that she had a Sonata ablation done which has helped with her bleeding tremendously.  However patient does admit that she is sometimes a poor eater and believes that her iron deficiency anemia may be related to her diet.   ? ?Patient also concerned today about recurrent sinus infections and snoring.  Patient states that she had a sleep study done which showed no signs symptoms of sleep apnea.  Patient states that she is taking Claritin, Zofran, and Singulair but still has a hard time being outside for any great length of time which contributes to her inability to exercise the way that she would like.  Patient requests an allergy referral.   ? ? ?Review of Systems  ?All other systems reviewed and are negative. ? ? ?   ?Objective:  ? Physical Exam ?Constitutional:   ?   General: She is not in acute distress. ?   Appearance: Normal appearance. She is obese. She is not ill-appearing, toxic-appearing or diaphoretic.  ?Cardiovascular:  ?   Rate and Rhythm: Normal rate and regular rhythm.  ?   Pulses: Normal pulses.  ?   Heart sounds: Normal heart sounds. No murmur heard. ?Pulmonary:  ?   Effort: Pulmonary effort is normal. No respiratory distress.  ?   Breath sounds: Normal breath sounds. No wheezing.  ?Skin: ?   General: Skin is warm and dry.  ?   Capillary Refill: Capillary refill takes less than 2 seconds.  ?Neurological:  ?   General: No focal deficit  present.  ?   Mental Status: She is alert and oriented to person, place, and time.  ?Psychiatric:     ?   Mood and Affect: Mood normal.     ?   Behavior: Behavior normal.  ? ? ? ?   ?Assessment & Plan:  ? ?1. Establishing care with new doctor, encounter for ?- RTC in 3 months ? ?2. Class 3 obesity (HCC) ?- Patient not currently taking Saxenda.  But will notify myself via MyChart if she starts to take Saxenda. ?-Discussed side effects of Saxenda in detail.   ?-Discussed risk of rebound weight gain if lifestyle modifications not established first. ?- CMP14+EGFR ?- Follow up in approximately 3 months ?- Continue to eat well balanced meals and try to exercise 30 minutes a day for 3-5x a week.  ? ?3. Recurrent rhinosinusitis ?- Allergy referral placed.  ?- Recurrent snoring, may be related obesity and maybe relieved by a decrease in weight. ? ?4. Iron deficiency anemia, unspecified iron deficiency anemia type ?- No current s/s of anemia however will evaluate cbc and iron.  ?- CBC with Differential ?- Fe+TIBC+Fer ?- Follow up in ~ 3 months.  ? ?5. Hypothyroidism, unspecified type ?- TSH + free T4 ? ?6. Fatty liver ?- CMP14+EGFR  ?

## 2021-11-29 LAB — IRON,TIBC AND FERRITIN PANEL
Ferritin: 115 ng/mL (ref 15–150)
Iron Saturation: 30 % (ref 15–55)
Iron: 93 ug/dL (ref 27–159)
Total Iron Binding Capacity: 310 ug/dL (ref 250–450)
UIBC: 217 ug/dL (ref 131–425)

## 2021-11-29 LAB — CMP14+EGFR
ALT: 21 IU/L (ref 0–32)
AST: 20 IU/L (ref 0–40)
Albumin/Globulin Ratio: 1.6 (ref 1.2–2.2)
Albumin: 4.3 g/dL (ref 3.8–4.8)
Alkaline Phosphatase: 76 IU/L (ref 44–121)
BUN/Creatinine Ratio: 16 (ref 9–23)
BUN: 14 mg/dL (ref 6–24)
Bilirubin Total: 0.3 mg/dL (ref 0.0–1.2)
CO2: 23 mmol/L (ref 20–29)
Calcium: 9.6 mg/dL (ref 8.7–10.2)
Chloride: 102 mmol/L (ref 96–106)
Creatinine, Ser: 0.85 mg/dL (ref 0.57–1.00)
Globulin, Total: 2.7 g/dL (ref 1.5–4.5)
Glucose: 84 mg/dL (ref 70–99)
Potassium: 4.3 mmol/L (ref 3.5–5.2)
Sodium: 137 mmol/L (ref 134–144)
Total Protein: 7 g/dL (ref 6.0–8.5)
eGFR: 83 mL/min/{1.73_m2} (ref >59)

## 2021-11-29 LAB — TSH+FREE T4
Free T4: 1.33 ng/dL (ref 0.82–1.77)
TSH: 1.5 u[IU]/mL (ref 0.450–4.500)

## 2021-11-29 LAB — CBC WITH DIFFERENTIAL/PLATELET
Basophils Absolute: 0.1 10*3/uL (ref 0.0–0.2)
Basos: 1 %
EOS (ABSOLUTE): 0.2 10*3/uL (ref 0.0–0.4)
Eos: 3 %
Hematocrit: 41.4 % (ref 34.0–46.6)
Hemoglobin: 13.9 g/dL (ref 11.1–15.9)
Immature Grans (Abs): 0 10*3/uL (ref 0.0–0.1)
Immature Granulocytes: 0 %
Lymphocytes Absolute: 2.2 10*3/uL (ref 0.7–3.1)
Lymphs: 32 %
MCH: 29.6 pg (ref 26.6–33.0)
MCHC: 33.6 g/dL (ref 31.5–35.7)
MCV: 88 fL (ref 79–97)
Monocytes Absolute: 0.7 10*3/uL (ref 0.1–0.9)
Monocytes: 10 %
Neutrophils Absolute: 3.8 10*3/uL (ref 1.4–7.0)
Neutrophils: 54 %
Platelets: 348 10*3/uL (ref 150–450)
RBC: 4.69 x10E6/uL (ref 3.77–5.28)
RDW: 12.1 % (ref 11.7–15.4)
WBC: 7 10*3/uL (ref 3.4–10.8)

## 2021-12-01 LAB — COLOGUARD: COLOGUARD: NEGATIVE

## 2021-12-12 ENCOUNTER — Other Ambulatory Visit (HOSPITAL_COMMUNITY): Payer: Self-pay

## 2022-01-20 ENCOUNTER — Ambulatory Visit: Payer: No Typology Code available for payment source | Admitting: Allergy & Immunology

## 2022-03-08 ENCOUNTER — Encounter (HOSPITAL_COMMUNITY): Payer: Self-pay | Admitting: Hematology and Oncology

## 2022-03-08 ENCOUNTER — Ambulatory Visit: Payer: No Typology Code available for payment source | Admitting: Allergy & Immunology

## 2022-03-08 ENCOUNTER — Encounter: Payer: Self-pay | Admitting: Allergy & Immunology

## 2022-03-08 VITALS — BP 132/86 | HR 80 | Temp 98.7°F | Resp 19 | Ht 61.5 in | Wt 239.5 lb

## 2022-03-08 DIAGNOSIS — J3089 Other allergic rhinitis: Secondary | ICD-10-CM | POA: Diagnosis not present

## 2022-03-08 DIAGNOSIS — R0602 Shortness of breath: Secondary | ICD-10-CM | POA: Diagnosis not present

## 2022-03-08 DIAGNOSIS — J310 Chronic rhinitis: Secondary | ICD-10-CM

## 2022-03-08 DIAGNOSIS — K9049 Malabsorption due to intolerance, not elsewhere classified: Secondary | ICD-10-CM | POA: Diagnosis not present

## 2022-03-08 DIAGNOSIS — J302 Other seasonal allergic rhinitis: Secondary | ICD-10-CM

## 2022-03-08 NOTE — Patient Instructions (Addendum)
1. Chronic rhinitis - Testing today showed: grasses, ragweed, trees, indoor molds, outdoor molds, and cockroach. - Copy of test results provided.  - Avoidance measures provided. - Continue with: your alternating antihistamines as you are doing - Start taking: Singulair (montelukast) '10mg'$  daily and Ryaltris (olopatadine/mometasone) two sprays per nostril 1-2 times daily as needed - Sample of Ryaltris provided today.  - You can use an extra dose of the antihistamine, if needed, for breakthrough symptoms.  - Consider nasal saline rinses 1-2 times daily to remove allergens from the nasal cavities as well as help with mucous clearance (this is especially helpful to do before the nasal sprays are given) - Consider allergy shots as a means of long-term control. - Allergy shots "re-train" and "reset" the immune system to ignore environmental allergens and decrease the resulting immune response to those allergens (sneezing, itchy watery eyes, runny nose, nasal congestion, etc).    - Allergy shots improve symptoms in 75-85% of patients.  - We can discuss more at the next appointment if the medications are not working for you.  2. Food intolerance - Your reactions were not consistent with an IgE (allergy) mediated reaction. - Therefore I did not think that testing would be useful. - You would also be having the reaction more consistently with every exposure (rather than random exposures).  3. Shortness of breath - Lung testing looked good today. - We are starting you on a prednisone burst since we caused all of these issues.  - We are sorrrrrrry!   4. Return in about 2 months (around 05/08/2022).    Please inform us of any Emergency Department visits, hospitalizations, or changes in symptoms. Call us before going to the ED for breathing or allergy symptoms since we might be able to fit you in for a sick visit. Feel free to contact us anytime with any questions, problems, or concerns.  It was a  pleasure to meet you today! You are a HOOT!   Websites that have reliable patient information: 1. American Academy of Asthma, Allergy, and Immunology: www.aaaai.org 2. Food Allergy Research and Education (FARE): foodallergy.org 3. Mothers of Asthmatics: http://www.asthmacommunitynetwork.org 4. American College of Allergy, Asthma, and Immunology: www.acaai.org   COVID-19 Vaccine Information can be found at: [email or web address] For questions related to vaccine distribution or appointments, please email [email or web address] or call [phone].   We realize that you might be concerned about having an allergic reaction to the COVID19 vaccines. To help with that concern, WE ARE OFFERING THE COVID19 VACCINES IN OUR OFFICE! Ask the front desk for dates!     "Like" [place] on Facebook and Instagram for our latest updates!      A healthy democracy works best when [employer] participate! Make sure you are registered to vote! If you have moved or changed any of your contact information, you will need to get this updated before voting!  In some cases, you MAY be able to register to vote online: [email or web address]       Airborne Adult Perc - 03/08/22 1414     Time Antigen Placed 1415    Allergen Manufacturer Lavella Hammock    Location Back    Number of Test 59    Panel 1 Select    1. Control-Buffer 50% Glycerol Negative    2. Control-Histamine 1 mg/ml 2+    3. Albumin saline Negative    4. [place] Negative    5. [place] Negative    6. Johnson Negative    7. Buckner Blue Negative  8. Meadow Fescue Negative    9. Perennial Rye Negative    10. Sweet Vernal Negative    11. Timothy Negative    12. Cocklebur Negative    13. Burweed Marshelder Negative    14. Ragweed, short Negative    15. Ragweed, Giant Negative    16. Plantain,  English Negative    17. Lamb's Quarters Negative    18. Sheep Sorrell Negative     19. Rough Pigweed Negative    20. Marsh Elder, Rough Negative    21. Mugwort, Common Negative    22. Ash mix Negative    23. Birch mix Negative    24. Beech American Negative    25. Box, Elder Negative    26. Cedar, red Negative    27. Cottonwood, [place] Negative    28. Elm mix Negative    29. Hickory Negative    30. Maple mix Negative    31. Oak, [place] mix Negative    32. Pecan Pollen Negative    33. Pine mix Negative    34. Sycamore Eastern Negative    35. [place], Black Pollen Negative    36. Alternaria alternata Negative    37. Cladosporium Herbarum Negative    38. Aspergillus mix Negative    39. Penicillium mix Negative    40. Bipolaris sorokiniana (Helminthosporium) Negative    41. Drechslera spicifera (Curvularia) Negative    42. Mucor plumbeus Negative    43. Fusarium moniliforme Negative    44. Aureobasidium pullulans (pullulara) Negative    45. Rhizopus oryzae Negative    46. Botrytis cinera Negative    47. Epicoccum nigrum Negative    48. Phoma betae Negative    49. Candida Albicans Negative    50. Trichophyton mentagrophytes Negative    51. Mite, D Farinae  5,000 AU/ml Negative    52. Mite, D Pteronyssinus  5,000 AU/ml Negative    53. Cat Hair 10,000 BAU/ml Negative    54.  Dog Epithelia Negative    55. Mixed Feathers Negative    56. Horse Epithelia Negative    57. Cockroach, German Negative    58. Mouse Negative    59. Tobacco Leaf Negative             Intradermal - 03/08/22 1453     Time Antigen Placed 1500    Allergen Manufacturer Lavella Hammock    Location Arm    Number of Test 15    Intradermal Select    Control Negative    [place] 1+    Johnson 1+    7 Grass Negative    Ragweed mix 2+    Weed mix Negative    Tree mix 3+    Mold 1 Negative    Mold 2 2+    Mold 3 1+    Mold 4 Negative    Cat Negative    Dog Negative    Cockroach 1+    Mite mix Negative             Reducing Pollen Exposure  The American Academy of Allergy,  Asthma and Immunology suggests the following steps to reduce your exposure to pollen during allergy seasons.    Do not hang sheets or clothing out to dry; pollen may collect on these items. Do not mow lawns or spend time around freshly cut grass; mowing stirs up pollen. Keep windows closed at night.  Keep car windows closed while driving. Minimize morning activities outdoors, a time when pollen counts are  usually at their highest. Stay indoors as much as possible when pollen counts or humidity is high and on windy days when pollen tends to remain in the air longer. Use air conditioning when possible.  Many air conditioners have filters that trap the pollen spores. Use a HEPA room air filter to remove pollen form the indoor air you breathe.  Control of Mold Allergen   Mold and fungi can grow on a variety of surfaces provided certain temperature and moisture conditions exist.  Outdoor molds grow on plants, decaying vegetation and soil.  The major outdoor mold, Alternaria and Cladosporium, are found in very high numbers during hot and dry conditions.  Generally, a late Summer - Fall peak is seen for common outdoor fungal spores.  Rain will temporarily lower outdoor mold spore count, but counts rise rapidly when the rainy period ends.  The most important indoor molds are Aspergillus and Penicillium.  Dark, humid and poorly ventilated basements are ideal sites for mold growth.  The next most common sites of mold growth are the bathroom and the kitchen.  Outdoor (Seasonal) Mold Control  Positive outdoor molds via skin testing: Bipolaris (Helminthsporium), Drechslera (Curvalaria), and Mucor  Use air conditioning and keep windows closed Avoid exposure to decaying vegetation. Avoid leaf raking. Avoid grain handling. Consider wearing a face mask if working in moldy areas.    Indoor (Perennial) Mold Control   Positive indoor molds via skin testing: Fusarium, Aureobasidium (Pullulara), and  Rhizopus  Maintain humidity below 50%. Clean washable surfaces with 5% bleach solution. Remove sources e.g. contaminated carpets.    Control of Cockroach Allergen  Cockroach allergen has been identified as an important cause of acute attacks of asthma, especially in urban settings.  There are fifty-five species of cockroach that exist in the [place], however only three, the [place], [place] species produce allergen that can affect patients with Asthma.  Allergens can be obtained from fecal particles, egg casings and secretions from cockroaches.    Remove food sources. Reduce access to water. Seal access and entry points. Spray runways with 0.5-1% Diazinon or Chlorpyrifos Blow boric acid power under stoves and refrigerator. Place bait stations (hydramethylnon) at feeding sites.  Allergy Shots   Allergies are the result of a chain reaction that starts in the immune system. Your immune system controls how your body defends itself. For instance, if you have an allergy to pollen, your immune system identifies pollen as an invader or allergen. Your immune system overreacts by producing antibodies called Immunoglobulin E (IgE). These antibodies travel to cells that release chemicals, causing an allergic reaction.  The concept behind allergy immunotherapy, whether it is received in the form of shots or tablets, is that the immune system can be desensitized to specific allergens that trigger allergy symptoms. Although it requires time and patience, the payback can be long-term relief.  How Do Allergy Shots Work?  Allergy shots work much like a vaccine. Your body responds to injected amounts of a particular allergen given in increasing doses, eventually developing a resistance and tolerance to it. Allergy shots can lead to decreased, minimal or no allergy symptoms.  There generally are two phases: build-up and maintenance. Build-up often ranges from three to six months and  involves receiving injections with increasing amounts of the allergens. The shots are typically given once or twice a week, though more rapid build-up schedules are sometimes used.  The maintenance phase begins when the most effective dose is reached. This dose is different  for each person, depending on how allergic you are and your response to the build-up injections. Once the maintenance dose is reached, there are longer periods between injections, typically two to four weeks.  Occasionally doctors give cortisone-type shots that can temporarily reduce allergy symptoms. These types of shots are different and should not be confused with allergy immunotherapy shots.  Who Can Be Treated with Allergy Shots?  Allergy shots may be a good treatment approach for people with allergic rhinitis (hay fever), allergic asthma, conjunctivitis (eye allergy) or stinging insect allergy.   Before deciding to begin allergy shots, you should consider:   The length of allergy season and the severity of your symptoms  Whether medications and/or changes to your environment can control your symptoms  Your desire to avoid long-term medication use  Time: allergy immunotherapy requires a major time commitment  Cost: may vary depending on your insurance coverage  Allergy shots for children age 48 and older are effective and often well tolerated. They might prevent the onset of new allergen sensitivities or the progression to asthma.  Allergy shots are not started on patients who are pregnant but can be continued on patients who become pregnant while receiving them. In some patients with other medical conditions or who take certain common medications, allergy shots may be of risk. It is important to mention other medications you talk to your allergist.   When Will I Feel Better?  Some may experience decreased allergy symptoms during the build-up phase. For others, it may take as long as 12 months on the maintenance  dose. If there is no improvement after a year of maintenance, your allergist will discuss other treatment options with you.  If you aren't responding to allergy shots, it may be because there is not enough dose of the allergen in your vaccine or there are missing allergens that were not identified during your allergy testing. Other reasons could be that there are high levels of the allergen in your environment or major exposure to non-allergic triggers like tobacco smoke.  What Is the Length of Treatment?  Once the maintenance dose is reached, allergy shots are generally continued for three to five years. The decision to stop should be discussed with your allergist at that time. Some people may experience a permanent reduction of allergy symptoms. Others may relapse and a longer course of allergy shots can be considered.  What Are the Possible Reactions?  The two types of adverse reactions that can occur with allergy shots are local and systemic. Common local reactions include very mild redness and swelling at the injection site, which can happen immediately or several hours after. A systemic reaction, which is less common, affects the entire body or a particular body system. They are usually mild and typically respond quickly to medications. Signs include increased allergy symptoms such as sneezing, a stuffy nose or hives.  Rarely, a serious systemic reaction called anaphylaxis can develop. Symptoms include swelling in the throat, wheezing, a feeling of tightness in the chest, nausea or dizziness. Most serious systemic reactions develop within 30 minutes of allergy shots. This is why it is strongly recommended you wait in your doctor's office for 30 minutes after your injections. Your allergist is trained to watch for reactions, and his or her staff is trained and equipped with the proper medications to identify and treat them.  Who Should Administer Allergy Shots?  The preferred location for  receiving shots is your prescribing allergist's office. Injections can sometimes be given  at another facility where the physician and staff are trained to recognize and treat reactions, and have received instructions by your prescribing allergist.

## 2022-03-08 NOTE — Progress Notes (Unsigned)
NEW PATIENT  Date of Service/Encounter:  03/08/22  Consult requested by: AmeduiteTrenton Gammon, NP   Assessment:   Seasonal and perennial allergic rhinitis (grasses, ragweed, trees, indoor molds, outdoor molds, and cockroach)  Food intolerance  SOB (shortness of breath)  Plan/Recommendations:   1. Chronic rhinitis - Testing today showed: grasses, ragweed, trees, indoor molds, outdoor molds, and cockroach. - Copy of test results provided.  - Avoidance measures provided. - Continue with: your alternating antihistamines as you are doing - Start taking: Singulair (montelukast) '10mg'$  daily and Ryaltris (olopatadine/mometasone) two sprays per nostril 1-2 times daily as needed - Sample of Ryaltris provided today.  - You can use an extra dose of the antihistamine, if needed, for breakthrough symptoms.  - Consider nasal saline rinses 1-2 times daily to remove allergens from the nasal cavities as well as help with mucous clearance (this is especially helpful to do before the nasal sprays are given) - Consider allergy shots as a means of long-term control. - Allergy shots "re-train" and "reset" the immune system to ignore environmental allergens and decrease the resulting immune response to those allergens (sneezing, itchy watery eyes, runny nose, nasal congestion, etc).    - Allergy shots improve symptoms in 75-85% of patients.  - We can discuss more at the next appointment if the medications are not working for you.  2. Food intolerance - Your reactions were not consistent with an IgE (allergy) mediated reaction. - Therefore I did not think that testing would be useful. - You would also be having the reaction more consistently with every exposure (rather than random exposures).  3. Shortness of breath - Lung testing looked good today. - We are starting you on a prednisone burst since we caused all of these issues.   4. Return in about 2 months (around 05/08/2022).   This note in its  entirety was forwarded to the Provider who requested this consultation.  Subjective:   [name] is a 51 y.o. female presenting today for evaluation of  Chief Complaint  Patient presents with   Other    Snores and sounds congested during the night   Allergic Rhinitis     Seasonal allergies: pressure in ears, itchy/watery eyes, runny nose, sore throat, drainage, itchy ears, feels sluggish most of the time.     [name] has a history of the following: Patient Active Problem List   Diagnosis Date Noted   Seasonal and perennial allergic rhinitis 03/09/2022   Food intolerance 03/09/2022   SOB (shortness of breath) 03/09/2022   Colon cancer screening 08/24/2020   Fatty liver 08/24/2020   Morbid obesity ([place]) 08/24/2020   GAD (generalized anxiety disorder) 11/18/2019   Situational depression 11/18/2019   Family history of early CAD 10/08/2019   Elevated blood pressure reading without diagnosis of hypertension 10/08/2019   Chest pain 10/08/2019   Iron deficiency anemia 09/30/2019   Peripheral edema 03/12/2019   Hypothyroidism 12/04/2016   Class 3 obesity ([place]) 12/04/2016   Gastroesophageal reflux disease 12/04/2016   Chronic allergic rhinitis 12/04/2016   Tinnitus 12/04/2016    History obtained from: chart review and patient.  [name] was referred by Ameduite, Trenton Gammon, NP.     [name] is a 51 y.o. female presenting for an evaluation of allergies . She went to see her PCP. They were discussing various things at her visit. She has really gotten to know her PCP. Her husband has been complaining that she snores and has a "gurgling".  She had a sleep study years ago. She is working on decreasing her weight. Therefore there is concern that there are allergies involved in her symptoms.   She is currently on fluticasone and ipratropium nasal sprays for two years. She is good about using them and she can tell if she does not use her nose spray. She has not been on her  medications 3 days and she is surprised that she is feeling fairly well. Monday was a bad day which was the first day that she was off of the medications. She was having a lot of itchy eyes and congestion and sluggishness. She typically takes Claritin, but she will rotate between antihistamines since she was can fele that her body gets used to one.   She feels constant pressure behind both ears and slightly lower and reaching up to the occipital region. She has had problems with these issues since she was a child. She never had her tonsils removed at all. She grew up in [place], [place]. She has been in this area for 30ish years. She was in [place] for a few years (husband was in the [place]) and then they came back to [place].  She has a history of left sided tinnitus. Apparently this is thought to be related to multiple antibiotic course exposure during her adulthood.   She recently went to [facility] for some training and did fine there. She was in there in May or so.   She reprots that she can eat things and they "burn the roof of [her] mouth". This is not consistent. They are rather random foods including fruits. Pineapple is a trigger, especially fresh pineapple. This does not involve throat swelling, only blisters.   She does not have any children.  She had 1 child years ago who was born very premature.  She was hospitalized for 1 month after delivery, which was an emergent C-section.  Unfortunately, her boy did not live.  She has had issues with uterine fibroids and polyps since then and recently underwent a uterine ablation due to excessive bleeding.  Otherwise, there is no history of other atopic diseases, including drug allergies, stinging insect allergies, eczema, urticaria, or contact dermatitis. There is no significant infectious history. Vaccinations are up to date.    Past Medical History: Patient Active Problem List   Diagnosis Date Noted   Seasonal and perennial allergic  rhinitis 03/09/2022   Food intolerance 03/09/2022   SOB (shortness of breath) 03/09/2022   Colon cancer screening 08/24/2020   Fatty liver 08/24/2020   Morbid obesity ([place]) 08/24/2020   GAD (generalized anxiety disorder) 11/18/2019   Situational depression 11/18/2019   Family history of early CAD 10/08/2019   Elevated blood pressure reading without diagnosis of hypertension 10/08/2019   Chest pain 10/08/2019   Iron deficiency anemia 09/30/2019   Peripheral edema 03/12/2019   Hypothyroidism 12/04/2016   Class 3 obesity ([place]) 12/04/2016   Gastroesophageal reflux disease 12/04/2016   Chronic allergic rhinitis 12/04/2016   Tinnitus 12/04/2016    Medication List:  Allergies as of 03/08/2022   No Known Allergies      Medication List        Accurate as of March 08, 2022 11:59 PM. If you have any questions, ask your nurse or doctor.          APPLE CIDER VINEGAR PO [place] apple cider vinegar   CULTURELLE PO Take 1 tablet by mouth daily.   esomeprazole 40 MG capsule Commonly known as: NexIUM  Take 1 capsule (40 mg total) by mouth daily.   ferrous sulfate 325 (65 FE) MG EC tablet Take 1 tablet (325 mg total) by mouth daily with breakfast.   fluticasone 50 MCG/ACT nasal spray Commonly known as: FLONASE Place 2 sprays into both nostrils daily.   ibuprofen 800 MG tablet Commonly known as: ADVIL Take 1 tablet (800 mg total) by mouth every 8 (eight) hours as needed for moderate pain.   ipratropium 0.03 % nasal spray Commonly known as: ATROVENT Place 2 sprays into both nostrils 2 (two) times daily.   Latisse 0.03 % ophthalmic solution Generic drug: bimatoprost Apply 1 drop via applicator once a day as directed   levothyroxine 25 MCG tablet Commonly known as: SYNTHROID Take 1 tablet (25 mcg total) by mouth daily.   montelukast 10 MG tablet Commonly known as: SINGULAIR Take 1 tablet (10 mg total) by mouth at bedtime.   multivitamins with iron Tabs tablet Take 1  tablet by mouth daily.   naproxen 500 MG tablet Commonly known as: Naprosyn Take 1 tablet (500 mg total) by mouth 2 (two) times daily with a meal. During menses What changed:  when to take this reasons to take this   OVER THE COUNTER MEDICATION 2 beets gummies daily   OVER THE COUNTER MEDICATION Tumeric   OVER THE COUNTER MEDICATION Rotates allegra,Claritin, and zyrtec   sertraline 100 MG tablet Commonly known as: ZOLOFT Take 1 tablet (100 mg total) by mouth at bedtime.   vitamin B-12 1000 MCG tablet Commonly known as: CYANOCOBALAMIN Take 1,000 mcg by mouth 2 (two) times daily.   Vitamin D3 50 MCG (2000 UT) Chew Chew 2 capsules by mouth daily.        Birth History: non-contributory  Developmental History: non-contributory  Past Surgical History: Past Surgical History:  Procedure Laterality Date   BREAST BIOPSY     CERVICAL CERCLAGE     CESAREAN SECTION     HYSTEROSCOPY WITH RESECTOSCOPE N/A 02/11/2021   Procedure: HYSTEROSCOPY WITH MYOSURE;  Surgeon: Servando Salina, MD;  Location: [facility];  Service: Gynecology;  Laterality: N/A;     Family History: Family History  Problem Relation Age of Onset   Allergic rhinitis Mother    Arthritis Mother    Hypertension Mother    Allergic rhinitis Father    Hyperlipidemia Father    Hypertension Father    Allergic rhinitis Brother    Cancer Maternal Aunt        breast   Breast cancer Maternal Aunt 58   Cancer Paternal Aunt        breast   Breast cancer Paternal Aunt 64   Breast cancer Paternal Aunt 81   Arthritis Maternal Grandmother    Stroke Maternal Grandfather    Diabetes Paternal Grandmother    Heart disease Paternal Grandfather    Asthma Niece      Social History: Luther lives at home with her husband. She has been with [employer] for 9 years coming up. She works with Engineer, technical sales.  Prior to that, she worked with Programme researcher, broadcasting/film/video.  They live in a house that is 51 years  old.  There is hardwood throughout the home.  They have gas and electric heating and central cooling.  There is 1 dog inside of the home.  There are no dust mite covers on the bedding.  There is no tobacco exposure.  She is not exposed to fumes, chemicals, or dust.  She does not have a  HEPA filter in the home.  She does not live near an interstate or industrial area.   Review of Systems  Constitutional: Negative.  Negative for chills, fever, malaise/fatigue and weight loss.  HENT:  Positive for congestion. Negative for ear discharge and ear pain.   Eyes:  Negative for pain, discharge and redness.  Respiratory:  Negative for cough, sputum production, shortness of breath and wheezing.   Cardiovascular: Negative.  Negative for chest pain and palpitations.  Gastrointestinal:  Negative for abdominal pain, heartburn, nausea and vomiting.  Skin: Negative.  Negative for itching and rash.  Neurological:  Positive for headaches. Negative for dizziness.  Endo/Heme/Allergies:  Positive for environmental allergies. Does not bruise/bleed easily.       Objective:   Blood pressure 132/86, pulse 80, temperature 98.7 F (37.1 C), resp. rate 19, height 5' 1.5" (1.562 m), weight 239 lb 8 oz (108.6 kg), SpO2 98 %. Body mass index is 44.52 kg/m.     Physical Exam Vitals reviewed.  Constitutional:      Appearance: She is well-developed.  HENT:     Head: Normocephalic and atraumatic.     Right Ear: Tympanic membrane, ear canal and external ear normal. No drainage, swelling or tenderness. Tympanic membrane is not injected, scarred, erythematous, retracted or bulging.     Left Ear: Tympanic membrane, ear canal and external ear normal. No drainage, swelling or tenderness. Tympanic membrane is not injected, scarred, erythematous, retracted or bulging.     Nose: No nasal deformity, septal deviation, mucosal edema or rhinorrhea.     Right Turbinates: Enlarged, swollen and pale.     Left Turbinates: Enlarged,  swollen and pale.     Right Sinus: No maxillary sinus tenderness or frontal sinus tenderness.     Left Sinus: No maxillary sinus tenderness or frontal sinus tenderness.     Comments: No nasal polyps.  Clear rhinorrhea especially on the right side.    Mouth/Throat:     Mouth: Mucous membranes are not pale and not dry.     Pharynx: Uvula midline.     Comments: Cobblestoning of the posterior oropharynx. Eyes:     General:        Right eye: No discharge.        Left eye: No discharge.     Conjunctiva/sclera: Conjunctivae normal.     Right eye: Right conjunctiva is not injected. No chemosis.    Left eye: Left conjunctiva is not injected. No chemosis.    Pupils: Pupils are equal, round, and reactive to light.  Cardiovascular:     Rate and Rhythm: Normal rate and regular rhythm.     Heart sounds: Normal heart sounds.  Pulmonary:     Effort: Pulmonary effort is normal. No tachypnea, accessory muscle usage or respiratory distress.     Breath sounds: Normal breath sounds. No wheezing, rhonchi or rales.  Chest:     Chest wall: No tenderness.  Abdominal:     Tenderness: There is no abdominal tenderness. There is no guarding or rebound.  Lymphadenopathy:     Head:     Right side of head: No submandibular, tonsillar or occipital adenopathy.     Left side of head: No submandibular, tonsillar or occipital adenopathy.     Cervical: No cervical adenopathy.  Skin:    Coloration: Skin is not pale.     Findings: No abrasion, erythema, petechiae or rash. Rash is not papular, urticarial or vesicular.  Neurological:     Mental Status: She is  alert.  Psychiatric:        Behavior: Behavior is cooperative.      Diagnostic studies:   Procedure spirometry after the skin testing because she was endorsing shortness of breath.  It was normal with an FEV1 of 1.87 L (84%) and an FVC of 2.23 L (81%).  The FEV1 to FVC ratio was 84%.  Allergy Studies:    Airborne Adult Perc - 03/08/22 1414     Time  Antigen Placed 1415    Allergen Manufacturer Lavella Hammock    Location Back    Number of Test 59    Panel 1 Select    1. Control-Buffer 50% Glycerol Negative    2. Control-Histamine 1 mg/ml 2+    3. Albumin saline Negative    4. [place] Negative    5. [place] Negative    6. [place] Negative    7. [place] Blue Negative    8. Meadow Fescue Negative    9. Perennial Rye Negative    10. Sweet Vernal Negative    11. Timothy Negative    12. Cocklebur Negative    13. Burweed Marshelder Negative    14. Ragweed, short Negative    15. Ragweed, Giant Negative    16. Plantain,  English Negative    17. Lamb's Quarters Negative    18. Sheep Sorrell Negative    19. Rough Pigweed Negative    20. Marsh Elder, Rough Negative    21. Mugwort, Common Negative    22. Ash mix Negative    23. Birch mix Negative    24. Beech American Negative    25. Box, Elder Negative    26. Cedar, red Negative    27. Cottonwood, [place] Negative    28. Elm mix Negative    29. Hickory Negative    30. Maple mix Negative    31. Oak, [place] mix Negative    32. Pecan Pollen Negative    33. Pine mix Negative    34. Sycamore Eastern Negative    35. [place], Black Pollen Negative    36. Alternaria alternata Negative    37. Cladosporium Herbarum Negative    38. Aspergillus mix Negative    39. Penicillium mix Negative    40. Bipolaris sorokiniana (Helminthosporium) Negative    41. Drechslera spicifera (Curvularia) Negative    42. Mucor plumbeus Negative    43. Fusarium moniliforme Negative    44. Aureobasidium pullulans (pullulara) Negative    45. Rhizopus oryzae Negative    46. Botrytis cinera Negative    47. Epicoccum nigrum Negative    48. Phoma betae Negative    49. Candida Albicans Negative    50. Trichophyton mentagrophytes Negative    51. Mite, D Farinae  5,000 AU/ml Negative    52. Mite, D Pteronyssinus  5,000 AU/ml Negative    53. Cat Hair 10,000 BAU/ml Negative    54.  Dog Epithelia Negative    55. Mixed  Feathers Negative    56. Horse Epithelia Negative    57. Cockroach, German Negative    58. Mouse Negative    59. Tobacco Leaf Negative             Intradermal - 03/08/22 1453     Time Antigen Placed 1500    Allergen Manufacturer Greer    Location Arm    Number of Test 15    Intradermal Select    Control Negative    [place] 1+    Johnson 1+    7  Grass Negative    Ragweed mix 2+    Weed mix Negative    Tree mix 3+    Mold 1 Negative    Mold 2 2+    Mold 3 1+    Mold 4 Negative    Cat Negative    Dog Negative    Cockroach 1+    Mite mix Negative             Allergy testing results were read and interpreted by myself, documented by clinical staff.         Salvatore Marvel, MD Allergy and [place] [facility] [place]

## 2022-03-09 ENCOUNTER — Encounter: Payer: Self-pay | Admitting: Allergy & Immunology

## 2022-03-09 DIAGNOSIS — K9049 Malabsorption due to intolerance, not elsewhere classified: Secondary | ICD-10-CM | POA: Insufficient documentation

## 2022-03-09 DIAGNOSIS — R0602 Shortness of breath: Secondary | ICD-10-CM | POA: Insufficient documentation

## 2022-03-09 DIAGNOSIS — J302 Other seasonal allergic rhinitis: Secondary | ICD-10-CM | POA: Insufficient documentation

## 2022-03-10 ENCOUNTER — Other Ambulatory Visit (HOSPITAL_COMMUNITY): Payer: Self-pay

## 2022-03-10 ENCOUNTER — Telehealth: Payer: Self-pay | Admitting: Allergy & Immunology

## 2022-03-10 MED ORDER — OLOPATADINE-MOMETASONE 665-25 MCG/ACT NA SUSP
2.0000 | Freq: Two times a day (BID) | NASAL | 5 refills | Status: AC | PRN
  Filled 2022-03-10: qty 29, 30d supply, fill #0

## 2022-03-10 NOTE — Telephone Encounter (Signed)
Patient states she spoke to her insurance in regards to the ryaltris prescription - ryaltris is not covered but the generic is. Patient would like a prescription sent in for the generic of ryaltris.   [place] contact number: [phone]

## 2022-03-10 NOTE — Telephone Encounter (Signed)
Generic ryalrtis is fine please. Olopatadine/mometasone nasal spray 2 sprays in each nostril twice a day as needed. Thank you

## 2022-03-10 NOTE — Telephone Encounter (Signed)
Left message informing patient that prescription has been sent to the requested pharmacy and if she has any questions or concerns to call the office.

## 2022-03-10 NOTE — Telephone Encounter (Signed)
Two separate medications is fine. She should also be able to get Ryaltrys through the specialty pharmacy for 25 bucks. Thank you

## 2022-03-13 ENCOUNTER — Ambulatory Visit: Payer: No Typology Code available for payment source | Admitting: Nurse Practitioner

## 2022-03-15 NOTE — Telephone Encounter (Signed)
Left a message for patient to call the office tomorrow so we can inform her of Anne's recommendation.

## 2022-03-17 ENCOUNTER — Other Ambulatory Visit (HOSPITAL_COMMUNITY): Payer: Self-pay

## 2022-03-17 MED ORDER — OLOPATADINE HCL 0.6 % NA SOLN
1.0000 | Freq: Two times a day (BID) | NASAL | 5 refills | Status: DC
  Filled 2022-03-17: qty 30.5, 30d supply, fill #0

## 2022-03-17 MED ORDER — MOMETASONE FUROATE 50 MCG/ACT NA SUSP
1.0000 | Freq: Two times a day (BID) | NASAL | 5 refills | Status: DC
  Filled 2022-03-17: qty 17, 30d supply, fill #0

## 2022-03-17 MED ORDER — FLUTICASONE PROPIONATE 50 MCG/ACT NA SUSP
2.0000 | Freq: Every day | NASAL | 5 refills | Status: DC | PRN
  Filled 2022-03-17 – 2022-03-28 (×2): qty 16, 30d supply, fill #0

## 2022-03-17 NOTE — Addendum Note (Signed)
Addended by: Clovis Cao A on: 03/17/2022 12:51 PM   Modules accepted: Orders

## 2022-03-17 NOTE — Telephone Encounter (Signed)
Called and left a message for patient to call our office back to review the notes from [place]. I will also sent patient a mychart message and send in the medications.

## 2022-03-28 ENCOUNTER — Other Ambulatory Visit (HOSPITAL_COMMUNITY): Payer: Self-pay

## 2022-03-28 DIAGNOSIS — Z0289 Encounter for other administrative examinations: Secondary | ICD-10-CM

## 2022-03-28 MED ORDER — ZOSTER VAC RECOMB ADJUVANTED 50 MCG/0.5ML IM SUSR
0.5000 mL | INTRAMUSCULAR | 1 refills | Status: AC
  Filled 2022-03-28: qty 0.5, 1d supply, fill #0

## 2022-03-29 ENCOUNTER — Encounter (INDEPENDENT_AMBULATORY_CARE_PROVIDER_SITE_OTHER): Payer: Self-pay | Admitting: Bariatrics

## 2022-03-29 ENCOUNTER — Other Ambulatory Visit (HOSPITAL_COMMUNITY): Payer: Self-pay

## 2022-03-29 ENCOUNTER — Ambulatory Visit (INDEPENDENT_AMBULATORY_CARE_PROVIDER_SITE_OTHER): Payer: No Typology Code available for payment source | Admitting: Bariatrics

## 2022-03-29 VITALS — BP 139/91 | HR 91 | Temp 98.0°F | Ht 62.0 in | Wt 235.0 lb

## 2022-03-29 DIAGNOSIS — Z0000 Encounter for general adult medical examination without abnormal findings: Secondary | ICD-10-CM

## 2022-03-29 DIAGNOSIS — E038 Other specified hypothyroidism: Secondary | ICD-10-CM | POA: Diagnosis not present

## 2022-03-29 DIAGNOSIS — Z6841 Body Mass Index (BMI) 40.0 and over, adult: Secondary | ICD-10-CM

## 2022-03-29 DIAGNOSIS — K760 Fatty (change of) liver, not elsewhere classified: Secondary | ICD-10-CM

## 2022-03-29 DIAGNOSIS — E559 Vitamin D deficiency, unspecified: Secondary | ICD-10-CM

## 2022-03-29 DIAGNOSIS — R5383 Other fatigue: Secondary | ICD-10-CM

## 2022-03-29 DIAGNOSIS — D508 Other iron deficiency anemias: Secondary | ICD-10-CM

## 2022-03-29 DIAGNOSIS — Z1331 Encounter for screening for depression: Secondary | ICD-10-CM

## 2022-03-29 DIAGNOSIS — R0602 Shortness of breath: Secondary | ICD-10-CM | POA: Diagnosis not present

## 2022-03-29 DIAGNOSIS — R7309 Other abnormal glucose: Secondary | ICD-10-CM

## 2022-03-30 ENCOUNTER — Ambulatory Visit (INDEPENDENT_AMBULATORY_CARE_PROVIDER_SITE_OTHER): Payer: No Typology Code available for payment source | Admitting: Bariatrics

## 2022-03-30 LAB — LIPID PANEL WITH LDL/HDL RATIO
Cholesterol, Total: 176 mg/dL (ref 100–199)
HDL: 63 mg/dL (ref >39)
LDL Chol Calc (NIH): 101 mg/dL — ABNORMAL HIGH (ref 0–99)
LDL/HDL Ratio: 1.6 ratio (ref 0.0–3.2)
Triglycerides: 63 mg/dL (ref 0–149)
VLDL Cholesterol Cal: 12 mg/dL (ref 5–40)

## 2022-03-30 LAB — ANEMIA PANEL
Ferritin: 76 ng/mL (ref 15–150)
Folate, Hemolysate: 464 ng/mL
Folate, RBC: 965 ng/mL (ref >498)
Hematocrit: 48.1 % — ABNORMAL HIGH (ref 34.0–46.6)
Iron Saturation: 29 % (ref 15–55)
Iron: 96 ug/dL (ref 27–159)
Retic Ct Pct: 2 % (ref 0.6–2.6)
Total Iron Binding Capacity: 329 ug/dL (ref 250–450)
UIBC: 233 ug/dL (ref 131–425)
Vitamin B-12: 678 pg/mL (ref 232–1245)

## 2022-03-30 LAB — COMPREHENSIVE METABOLIC PANEL
ALT: 20 IU/L (ref 0–32)
AST: 15 IU/L (ref 0–40)
Albumin/Globulin Ratio: 1.6 (ref 1.2–2.2)
Albumin: 4.4 g/dL (ref 3.9–4.9)
Alkaline Phosphatase: 70 IU/L (ref 44–121)
BUN/Creatinine Ratio: 15 (ref 9–23)
BUN: 13 mg/dL (ref 6–24)
Bilirubin Total: 0.3 mg/dL (ref 0.0–1.2)
CO2: 21 mmol/L (ref 20–29)
Calcium: 9.7 mg/dL (ref 8.7–10.2)
Chloride: 104 mmol/L (ref 96–106)
Creatinine, Ser: 0.86 mg/dL (ref 0.57–1.00)
Globulin, Total: 2.8 g/dL (ref 1.5–4.5)
Glucose: 70 mg/dL (ref 70–99)
Potassium: 4.3 mmol/L (ref 3.5–5.2)
Sodium: 143 mmol/L (ref 134–144)
Total Protein: 7.2 g/dL (ref 6.0–8.5)
eGFR: 82 mL/min/{1.73_m2} (ref >59)

## 2022-03-30 LAB — VITAMIN D 25 HYDROXY (VIT D DEFICIENCY, FRACTURES): Vit D, 25-Hydroxy: 32.4 ng/mL (ref 30.0–100.0)

## 2022-03-30 LAB — TSH+T4F+T3FREE
Free T4: 1.42 ng/dL (ref 0.82–1.77)
T3, Free: 3.7 pg/mL (ref 2.0–4.4)
TSH: 1.47 u[IU]/mL (ref 0.450–4.500)

## 2022-03-30 LAB — HEMOGLOBIN A1C
Est. average glucose Bld gHb Est-mCnc: 105 mg/dL
Hgb A1c MFr Bld: 5.3 % (ref 4.8–5.6)

## 2022-03-30 LAB — INSULIN, RANDOM: INSULIN: 16.7 u[IU]/mL (ref 2.6–24.9)

## 2022-04-03 ENCOUNTER — Other Ambulatory Visit (HOSPITAL_COMMUNITY): Payer: Self-pay

## 2022-04-05 NOTE — Progress Notes (Signed)
Chief Complaint:   OBESITY [name] (MR# [id number]) is a 51 y.o. female who presents for evaluation and treatment of obesity and related comorbidities. Current BMI is Body mass index is 42.98 kg/m. [name] has been struggling with her weight for many years and has been unsuccessful in either losing weight, maintaining weight loss, or reaching her healthy weight goal.  [name] states that she does not like to cook.  She states that she can be a picky eater.  [name] is currently in the action stage of change and ready to dedicate time achieving and maintaining a healthier weight. [name] is interested in becoming our patient and working on intensive lifestyle modifications including (but not limited to) diet and exercise for weight loss.  [name]'s habits were reviewed today and are as follows: Her family eats meals together, she thinks her family will eat healthier with her, her desired weight loss is 70 lbs, she started gaining weight in the last 3 years, her heaviest weight ever was 234 pounds, she is a picky eater and doesn't like to eat healthier foods, she snacks frequently in the evenings, she wakes up frequently in the middle of the night to eat, she skips meals frequently, she is frequently drinking liquids with calories, she frequently makes poor food choices, and she struggles with emotional eating.  Depression Screen [name]'s Food and Mood (modified PHQ-9) score was 13.     03/29/2022    9:00 AM  Depression screen PHQ 2/9  Decreased Interest 2  Down, Depressed, Hopeless 1  PHQ - 2 Score 3  Altered sleeping 1  Tired, decreased energy 3  Change in appetite 2  Feeling bad or failure about yourself  2  Trouble concentrating 1  Moving slowly or fidgety/restless 1  Suicidal thoughts 0  PHQ-9 Score 13  Difficult doing work/chores Not difficult at all   Subjective:   1. Other fatigue [name] admits to daytime somnolence and admits to waking up still tired. Patient has a  history of symptoms of daytime fatigue, morning fatigue, and morning headache. [name] generally gets 6 or 8 hours of sleep per night, and states that she has nightime awakenings. Snoring is present. Apneic episodes are not present. Epworth Sleepiness Score is 5.   2. SOB (shortness of breath) on exertion [name] notes increasing shortness of breath with exercising and seems to be worsening over time with weight gain. She notes getting out of breath sooner with activity than she used to. This has not gotten worse recently. [name] denies shortness of breath at rest or orthopnea.  3. Other specified hypothyroidism [name] is taking levothyroxine currently.  4. Vitamin D deficiency [name] is taking vitamin D currently.  5. Health care maintenance Obesity with comorbidity.  6. Fatty liver [name] has not had an official diagnosis, but she has had an ultrasound.  7. Other iron deficiency anemia [name]'s last CBC was good.  She has a history of iron infusions.  8. Elevated glucose [name] is not on medications currently.  Assessment/Plan:   1. Other fatigue [name] does feel that her weight is causing her energy to be lower than it should be. Fatigue may be related to obesity, depression or many other causes. Labs will be ordered, and in the meanwhile, [name] will focus on self care including making healthy food choices, increasing physical activity and focusing on stress reduction.  - EKG 12-Lead - Comprehensive metabolic panel - [id number]  2. SOB (shortness of breath) on exertion [name] does  feel that she gets out of breath more easily that she used to when she exercises. [name]'s shortness of breath appears to be obesity related and exercise induced. She has agreed to work on weight loss and gradually increase exercise to treat her exercise induced shortness of breath. Will continue to monitor closely.  - Comprehensive metabolic panel - [id number]  3. Other specified hypothyroidism We will  check labs today, and Sparkle will continue her current medications as directed.  4. Vitamin D deficiency We will check labs today, and [name] will continue her vitamin D supplementation.  - VITAMIN D 25 Hydroxy (Vit-D Deficiency, Fractures)  5. Health care maintenance We will check labs today, and we will follow-up at [facility] next office visit.  - Comprehensive metabolic panel - Lipid Panel With LDL/HDL Ratio  6. Fatty liver We will check labs today, and [name] will work on her weight loss efforts.  - Comprehensive metabolic panel  7. Other iron deficiency anemia We will check labs today, and we will follow-up at [facility] next office visit.  - Anemia panel  8. Elevated glucose We will check labs today, and we will follow-up at [facility] next office visit.  - Hemoglobin A1c - Insulin, random - Lipid Panel With LDL/HDL Ratio  9. Depression screening [name] had a positive depression screening. Depression is commonly associated with obesity and often results in emotional eating behaviors. We will monitor this closely and work on CBT to help improve the non-hunger eating patterns. Referral to Psychology may be required if no improvement is seen as she continues in our clinic.  10. Class 3 severe obesity with serious comorbidity and body mass index (BMI) of 40.0 to 44.9 in adult, unspecified obesity type (HCC) [name] is currently in the action stage of change and her goal is to continue with weight loss efforts. I recommend [name] begin the structured treatment plan as follows:  She has agreed to the Category 2 Plan.  Meal planning and intentional eating were discussed.  She will minimize meal skipping and eliminate sugary drinks.  Reviewed labs from 11/29/2021 with the patient today, CMP and A1c.  Exercise goals: No exercise has been prescribed at this time.   Behavioral modification strategies: increasing lean protein intake, decreasing simple carbohydrates, increasing vegetables,  increasing water intake, decreasing eating out, no skipping meals, meal planning and cooking strategies, keeping healthy foods in the home, and planning for success.  She was informed of the importance of frequent follow-up visits to maximize her success with intensive lifestyle modifications for her multiple health conditions. She was informed we would discuss her lab results at her next visit unless there is a critical issue that needs to be addressed sooner. Mikiah agreed to keep her next visit at the agreed upon time to discuss these results.  Objective:   Blood pressure (!) 139/91, pulse 91, temperature 98 F (36.7 C), height '5\' 2"'$  (1.575 m), weight 235 lb (106.6 kg), last menstrual period 03/16/2022, SpO2 98 %. Body mass index is 42.98 kg/m.  EKG: Normal sinus rhythm, rate 85 BPM.  Indirect Calorimeter completed today shows a VO2 of 230 and a REE of 1584.  Her calculated basal metabolic rate is 0347 thus her basal metabolic rate is worse than expected.  General: Cooperative, alert, well developed, in no acute distress. HEENT: Conjunctivae and lids unremarkable. Cardiovascular: Regular rhythm.  Lungs: Normal work of breathing. Neurologic: No focal deficits.   Lab Results  Component Value Date   CREATININE 0.86 03/29/2022   BUN  13 03/29/2022   NA 143 03/29/2022   K 4.3 03/29/2022   CL 104 03/29/2022   CO2 21 03/29/2022   Lab Results  Component Value Date   ALT 20 03/29/2022   AST 15 03/29/2022   ALKPHOS 70 03/29/2022   BILITOT 0.3 03/29/2022   Lab Results  Component Value Date   HGBA1C 5.3 03/29/2022   HGBA1C 5.1 07/04/2021   Lab Results  Component Value Date   INSULIN 16.7 03/29/2022   Lab Results  Component Value Date   TSH 1.470 03/29/2022   Lab Results  Component Value Date   CHOL 176 03/29/2022   HDL 63 03/29/2022   LDLCALC 101 (H) 03/29/2022   TRIG 63 03/29/2022   CHOLHDL 2.6 07/04/2021   Lab Results  Component Value Date   WBC 7.0 11/28/2021   HGB  13.9 11/28/2021   HCT 48.1 (H) 03/29/2022   MCV 88 11/28/2021   PLT 348 11/28/2021   Lab Results  Component Value Date   IRON 96 03/29/2022   TIBC 329 03/29/2022   FERRITIN 76 03/29/2022   Attestation Statements:   Reviewed by clinician on day of visit: allergies, medications, problem list, medical history, surgical history, family history, social history, and previous encounter notes.  Wilhemena Durie, am acting as Location manager for [employer], DO.  I have reviewed the above documentation for accuracy and completeness, and I agree with the above. Jearld Lesch, DO

## 2022-04-10 ENCOUNTER — Other Ambulatory Visit (HOSPITAL_COMMUNITY): Payer: Self-pay

## 2022-04-12 ENCOUNTER — Other Ambulatory Visit (HOSPITAL_COMMUNITY): Payer: Self-pay

## 2022-04-12 ENCOUNTER — Encounter (INDEPENDENT_AMBULATORY_CARE_PROVIDER_SITE_OTHER): Payer: Self-pay | Admitting: Bariatrics

## 2022-04-12 ENCOUNTER — Ambulatory Visit (INDEPENDENT_AMBULATORY_CARE_PROVIDER_SITE_OTHER): Payer: No Typology Code available for payment source | Admitting: Bariatrics

## 2022-04-12 VITALS — BP 147/85 | HR 74 | Temp 98.4°F | Ht 62.0 in | Wt 233.0 lb

## 2022-04-12 DIAGNOSIS — E559 Vitamin D deficiency, unspecified: Secondary | ICD-10-CM | POA: Diagnosis not present

## 2022-04-12 DIAGNOSIS — Z6841 Body Mass Index (BMI) 40.0 and over, adult: Secondary | ICD-10-CM | POA: Diagnosis not present

## 2022-04-12 DIAGNOSIS — E669 Obesity, unspecified: Secondary | ICD-10-CM

## 2022-04-12 DIAGNOSIS — E8881 Metabolic syndrome: Secondary | ICD-10-CM

## 2022-04-12 MED ORDER — VITAMIN D (ERGOCALCIFEROL) 1.25 MG (50000 UNIT) PO CAPS
50000.0000 [IU] | ORAL_CAPSULE | ORAL | 0 refills | Status: DC
  Filled 2022-04-12 – 2022-04-26 (×2): qty 5, 35d supply, fill #0

## 2022-04-13 ENCOUNTER — Ambulatory Visit (INDEPENDENT_AMBULATORY_CARE_PROVIDER_SITE_OTHER): Payer: No Typology Code available for payment source | Admitting: Bariatrics

## 2022-04-19 ENCOUNTER — Encounter (INDEPENDENT_AMBULATORY_CARE_PROVIDER_SITE_OTHER): Payer: Self-pay

## 2022-04-20 ENCOUNTER — Other Ambulatory Visit (HOSPITAL_COMMUNITY): Payer: Self-pay

## 2022-04-20 NOTE — Progress Notes (Signed)
Chief Complaint:   OBESITY [name] is here to discuss her progress with her obesity treatment plan along with follow-up of her obesity related diagnoses. [name] is on the Category 2 Plan and states she is following her eating plan approximately 90% of the time. [name] states she is walking for 30 minutes 2-3 times per week.  Today's visit was #: 2 Starting weight: 235 lbs Starting date: 03/29/22 Today's weight: 233 lbs Today's date: 04/12/22 Total lbs lost to date: 2 Total lbs lost since last in-office visit: -2  Interim History: She is down 2 pounds since her first visit.  She is increasing her water.  Subjective:   1. Insulin resistance Insulin 16.7. A1c is 5.3.  2. Vitamin D deficiency Reviewed vitamin D-32.4. Taking vitamin D over-the-counter.   Assessment/Plan:   1. Insulin resistance 1. Will minimize all carbohydrates (sweets and starches).  2. Vitamin D deficiency New prescription for vitamin D 50,000 IU weekly, #5, no refills.  3. Obesity, current BMI 42.6 1.  Meal planning 2 reviewed labs with patient from 03/29/2022 (CMP, lipids, iron/anemia panel, vitamin D, A1c, insulin, thyroid panel). 3.  Eating out sheet  [name] is currently in the action stage of change. As such, her goal is to continue with weight loss efforts. She has agreed to the Category 2 Plan.   Exercise goals: Increase workout, walk 4 times per week.  Behavioral modification strategies: increasing lean protein intake, decreasing simple carbohydrates, increasing vegetables, increasing water intake, decreasing eating out, no skipping meals, meal planning and cooking strategies, keeping healthy foods in the home, and planning for success.  [name] has agreed to follow-up with our clinic in 2-3 weeks. She was informed of the importance of frequent follow-up visits to maximize her success with intensive lifestyle modifications for her multiple health conditions.   Objective:   Blood pressure (!)  147/85, pulse 74, temperature 98.4 F (36.9 C), height '5\' 2"'$  (1.575 m), weight 233 lb (105.7 kg), last menstrual period 03/16/2022, SpO2 100 %. Body mass index is 42.62 kg/m.  General: Cooperative, alert, well developed, in no acute distress. HEENT: Conjunctivae and lids unremarkable. Cardiovascular: Regular rhythm.  Lungs: Normal work of breathing. Neurologic: No focal deficits.   Lab Results  Component Value Date   CREATININE 0.86 03/29/2022   BUN 13 03/29/2022   NA 143 03/29/2022   K 4.3 03/29/2022   CL 104 03/29/2022   CO2 21 03/29/2022   Lab Results  Component Value Date   ALT 20 03/29/2022   AST 15 03/29/2022   ALKPHOS 70 03/29/2022   BILITOT 0.3 03/29/2022   Lab Results  Component Value Date   HGBA1C 5.3 03/29/2022   HGBA1C 5.1 07/04/2021   Lab Results  Component Value Date   INSULIN 16.7 03/29/2022   Lab Results  Component Value Date   TSH 1.470 03/29/2022   Lab Results  Component Value Date   CHOL 176 03/29/2022   HDL 63 03/29/2022   LDLCALC 101 (H) 03/29/2022   TRIG 63 03/29/2022   CHOLHDL 2.6 07/04/2021   Lab Results  Component Value Date   VD25OH 32.4 03/29/2022   VD25OH 28 (L) 12/04/2016   Lab Results  Component Value Date   WBC 7.0 11/28/2021   HGB 13.9 11/28/2021   HCT 48.1 (H) 03/29/2022   MCV 88 11/28/2021   PLT 348 11/28/2021   Lab Results  Component Value Date   IRON 96 03/29/2022   TIBC 329 03/29/2022   FERRITIN 76 03/29/2022  Attestation Statements:   Reviewed by clinician on day of visit: allergies, medications, problem list, medical history, surgical history, family history, social history, and previous encounter notes.  I, Dawn Whitmire, FNP-C, am acting as transcriptionist for Dr. Jearld Lesch.  I have reviewed the above documentation for accuracy and completeness, and I agree with the above. Jearld Lesch, DO

## 2022-04-22 ENCOUNTER — Encounter (INDEPENDENT_AMBULATORY_CARE_PROVIDER_SITE_OTHER): Payer: Self-pay | Admitting: Bariatrics

## 2022-04-26 ENCOUNTER — Ambulatory Visit (INDEPENDENT_AMBULATORY_CARE_PROVIDER_SITE_OTHER): Payer: No Typology Code available for payment source | Admitting: Family Medicine

## 2022-04-26 ENCOUNTER — Other Ambulatory Visit (HOSPITAL_COMMUNITY): Payer: Self-pay

## 2022-04-26 ENCOUNTER — Encounter (INDEPENDENT_AMBULATORY_CARE_PROVIDER_SITE_OTHER): Payer: Self-pay | Admitting: Family Medicine

## 2022-04-26 ENCOUNTER — Other Ambulatory Visit: Payer: Self-pay | Admitting: Nurse Practitioner

## 2022-04-26 VITALS — BP 141/85 | HR 80 | Temp 98.2°F | Ht 62.0 in | Wt 233.0 lb

## 2022-04-26 DIAGNOSIS — E669 Obesity, unspecified: Secondary | ICD-10-CM | POA: Diagnosis not present

## 2022-04-26 DIAGNOSIS — Z6841 Body Mass Index (BMI) 40.0 and over, adult: Secondary | ICD-10-CM | POA: Diagnosis not present

## 2022-04-26 DIAGNOSIS — E8881 Metabolic syndrome: Secondary | ICD-10-CM | POA: Diagnosis not present

## 2022-04-26 DIAGNOSIS — E559 Vitamin D deficiency, unspecified: Secondary | ICD-10-CM | POA: Diagnosis not present

## 2022-04-26 MED ORDER — VITAMIN D (ERGOCALCIFEROL) 1.25 MG (50000 UNIT) PO CAPS
50000.0000 [IU] | ORAL_CAPSULE | ORAL | 0 refills | Status: DC
  Filled 2022-04-26: qty 4, 28d supply, fill #0

## 2022-04-27 ENCOUNTER — Other Ambulatory Visit: Payer: Self-pay | Admitting: Nurse Practitioner

## 2022-04-27 ENCOUNTER — Other Ambulatory Visit (HOSPITAL_COMMUNITY): Payer: Self-pay

## 2022-04-27 DIAGNOSIS — K219 Gastro-esophageal reflux disease without esophagitis: Secondary | ICD-10-CM

## 2022-05-03 ENCOUNTER — Ambulatory Visit (INDEPENDENT_AMBULATORY_CARE_PROVIDER_SITE_OTHER): Payer: No Typology Code available for payment source | Admitting: Nurse Practitioner

## 2022-05-03 ENCOUNTER — Other Ambulatory Visit (HOSPITAL_COMMUNITY): Payer: Self-pay

## 2022-05-03 ENCOUNTER — Encounter: Payer: Self-pay | Admitting: Nurse Practitioner

## 2022-05-03 VITALS — BP 128/80 | Ht 62.0 in | Wt 239.2 lb

## 2022-05-03 DIAGNOSIS — K219 Gastro-esophageal reflux disease without esophagitis: Secondary | ICD-10-CM

## 2022-05-03 DIAGNOSIS — Z0000 Encounter for general adult medical examination without abnormal findings: Secondary | ICD-10-CM

## 2022-05-03 MED ORDER — FAMOTIDINE 20 MG PO TABS
20.0000 mg | ORAL_TABLET | Freq: Two times a day (BID) | ORAL | 2 refills | Status: DC
  Filled 2022-05-03 (×2): qty 60, 30d supply, fill #0
  Filled 2022-07-26: qty 60, 30d supply, fill #1
  Filled 2022-09-13: qty 60, 30d supply, fill #2

## 2022-05-03 NOTE — Progress Notes (Signed)
Subjective:    Patient ID: [name], female    DOB: 1970/09/21, 51 y.o.   MRN: [id number]  HPI The patient comes in today for a wellness visit.    A review of their health history was completed.  A review of medications was also completed.  Any needed refills; no  Eating habits: trying to eat healthy  Falls/  MVA accidents in past few months: no  Regular exercise: yes- walking  Specialist pt sees on regular basis: allergy dr and healthy weight and wellness  Preventative health issues were discussed.   Additional concerns: Patient has concerns regarding acid reflux.  Patient states that she typically did not eat but now that she is a part of the weight management program she has been trying to eat.  As a result patient states that she has noticed more acid reflux.  Patient states that coffee irritates her stomach.  Patient also states that she had salmon with limited juice and a salad with [place] dressing that irritated her stomach.  Patient typically takes Nexium for gastric reflux however states that she does not want to be on Nexium long-term.  Patient would like to know if there is another medication that she can try without as many side effects.   Review of Systems  All other systems reviewed and are negative.      Objective:   Physical Exam Vitals reviewed.  Constitutional:      General: She is not in acute distress.    Appearance: Normal appearance. She is normal weight. She is not ill-appearing, toxic-appearing or diaphoretic.  HENT:     Head: Normocephalic and atraumatic.  Cardiovascular:     Rate and Rhythm: Normal rate and regular rhythm.     Pulses: Normal pulses.     Heart sounds: Normal heart sounds. No murmur heard. Pulmonary:     Effort: Pulmonary effort is normal. No respiratory distress.     Breath sounds: Normal breath sounds. No wheezing.  Abdominal:     General: Abdomen is flat. Bowel sounds are normal. There is no distension.      Palpations: Abdomen is soft. There is no mass.     Tenderness: There is no abdominal tenderness. There is no rebound.     Hernia: No hernia is present.  Musculoskeletal:     Comments: Grossly intact  Skin:    General: Skin is warm.     Capillary Refill: Capillary refill takes less than 2 seconds.  Neurological:     Mental Status: She is alert.     Comments: Grossly intact  Psychiatric:        Mood and Affect: Mood normal.        Behavior: Behavior normal.           Assessment & Plan:   1. Wellness examination Adult wellness-complete.wellness physical was conducted today. Importance of diet and exercise were discussed in detail.  Importance of stress reduction and healthy living were discussed.  In addition to this a discussion regarding safety was also covered.  We also reviewed over immunizations and gave recommendations regarding current immunization needed for age.   In addition to this additional areas were also touched on including: Preventative health exams needed:  Colonoscopy : Cologaurd ordered on 08/30/2021 by [facility]. However, no results in chart. Patient notified to sign release of records form to get results. PAP: UTD due on 03/2023 Shingrix: not done. Patient encouraged to complete at local pharmacy Mammogram: current Tetanus: Current  Patient was advised yearly wellness exam   2. Gastroesophageal reflux disease, unspecified whether esophagitis present - Patient to avoid triggers - Trial Pepcid - Continue with wait loss.   - famotidine (PEPCID) 20 MG tablet; Take 1 tablet (20 mg total) by mouth 2 (two) times daily.  Dispense: 60 tablet; Refill: 2 - Continue to follow up with weight management  Follow-up in 1 year for annual  exam or sooner if needed   6 month follow up otherwise.  Note:  This document was prepared using Dragon voice recognition software and may include unintentional dictation errors. Note - This record has been created using  [employer].  Chart creation errors have been sought, but may not always  have been located. Such creation errors do not reflect on  the standard of medical care.

## 2022-05-06 NOTE — Progress Notes (Unsigned)
Chief Complaint:   OBESITY [name] is here to discuss her progress with her obesity treatment plan along with follow-up of her obesity related diagnoses. [name] is on the Category 2 Plan and states she is following her eating plan approximately 80% of the time. [name] states she is walking for 30 minutes 4 times per week.  Today's visit was #: 3 Starting weight: 235 lbs Starting date: 03/29/2022 Today's weight: 233 lbs Today's date: 04/26/2022 Total lbs lost to date: 2 Total lbs lost since last in-office visit: 0  Interim History: [name] continues to work on her weight loss. She has had a lot of sick family members and meal planning has been difficult. Her hunger is controlled.   Subjective:   1. Vitamin D deficiency [name] is on OTC Vitamin D, but her level is not at goal and she notes fatigue.   2. Insulin resistance [name] has elevated insulin but no polyphagia. She is working on her diet and weight loss.   Assessment/Plan:   1. Vitamin D deficiency [name] agreed to start prescription Vitamin D 50,000 IU every week, with no refills. She will follow-up for routine testing of Vitamin D, at least 2-3 times per year to avoid over-replacement.  - Vitamin D, Ergocalciferol, (DRISDOL) 1.25 MG (50000 UNIT) CAPS capsule; Take 1 capsule (50,000 Units total) by mouth every 7 (seven) days.  Dispense: 4 capsule; Refill: 0  2. Insulin resistance [name] will continue with her diet and exercise, and she defers metformin for now.   3. Obesity, Current BMI 42.7 [name] is currently in the action stage of change. As such, her goal is to continue with weight loss efforts. She has agreed to to get back to the Category 2 Plan strictly.   Exercise goals: As is.   Behavioral modification strategies: increasing lean protein intake.  [name] has agreed to follow-up with our clinic in 2 weeks. She was informed of the importance of frequent follow-up visits to maximize her success with intensive lifestyle  modifications for her multiple health conditions.   Objective:   Blood pressure (!) 141/85, pulse 80, temperature 98.2 F (36.8 C), height '5\' 2"'$  (1.575 m), weight 233 lb (105.7 kg), last menstrual period 03/16/2022, SpO2 99 %. Body mass index is 42.62 kg/m.  General: Cooperative, alert, well developed, in no acute distress. HEENT: Conjunctivae and lids unremarkable. Cardiovascular: Regular rhythm.  Lungs: Normal work of breathing. Neurologic: No focal deficits.   Lab Results  Component Value Date   CREATININE 0.86 03/29/2022   BUN 13 03/29/2022   NA 143 03/29/2022   K 4.3 03/29/2022   CL 104 03/29/2022   CO2 21 03/29/2022   Lab Results  Component Value Date   ALT 20 03/29/2022   AST 15 03/29/2022   ALKPHOS 70 03/29/2022   BILITOT 0.3 03/29/2022   Lab Results  Component Value Date   HGBA1C 5.3 03/29/2022   HGBA1C 5.1 07/04/2021   Lab Results  Component Value Date   INSULIN 16.7 03/29/2022   Lab Results  Component Value Date   TSH 1.470 03/29/2022   Lab Results  Component Value Date   CHOL 176 03/29/2022   HDL 63 03/29/2022   LDLCALC 101 (H) 03/29/2022   TRIG 63 03/29/2022   CHOLHDL 2.6 07/04/2021   Lab Results  Component Value Date   VD25OH 32.4 03/29/2022   VD25OH 28 (L) 12/04/2016   Lab Results  Component Value Date   WBC 7.0 11/28/2021   HGB 13.9 11/28/2021  HCT 48.1 (H) 03/29/2022   MCV 88 11/28/2021   PLT 348 11/28/2021   Lab Results  Component Value Date   IRON 96 03/29/2022   TIBC 329 03/29/2022   FERRITIN 76 03/29/2022   Attestation Statements:   Reviewed by clinician on day of visit: allergies, medications, problem list, medical history, surgical history, family history, social history, and previous encounter notes.   I, Trixie Dredge, am acting as transcriptionist for Dennard Nip, MD.  I have reviewed the above documentation for accuracy and completeness, and I agree with the above. -  Dennard Nip, MD

## 2022-05-30 ENCOUNTER — Encounter (INDEPENDENT_AMBULATORY_CARE_PROVIDER_SITE_OTHER): Payer: Self-pay | Admitting: Family Medicine

## 2022-05-30 ENCOUNTER — Ambulatory Visit (INDEPENDENT_AMBULATORY_CARE_PROVIDER_SITE_OTHER): Payer: No Typology Code available for payment source | Admitting: Family Medicine

## 2022-05-30 ENCOUNTER — Other Ambulatory Visit (HOSPITAL_COMMUNITY): Payer: Self-pay

## 2022-05-30 VITALS — BP 150/86 | HR 86 | Temp 98.2°F | Ht 62.0 in | Wt 234.0 lb

## 2022-05-30 DIAGNOSIS — E669 Obesity, unspecified: Secondary | ICD-10-CM

## 2022-05-30 DIAGNOSIS — Z6841 Body Mass Index (BMI) 40.0 and over, adult: Secondary | ICD-10-CM

## 2022-05-30 DIAGNOSIS — E559 Vitamin D deficiency, unspecified: Secondary | ICD-10-CM | POA: Diagnosis not present

## 2022-05-30 DIAGNOSIS — K219 Gastro-esophageal reflux disease without esophagitis: Secondary | ICD-10-CM

## 2022-05-30 MED ORDER — VITAMIN D (ERGOCALCIFEROL) 1.25 MG (50000 UNIT) PO CAPS
50000.0000 [IU] | ORAL_CAPSULE | ORAL | 0 refills | Status: DC
  Filled 2022-05-30: qty 4, 28d supply, fill #0

## 2022-05-31 ENCOUNTER — Ambulatory Visit: Payer: No Typology Code available for payment source | Admitting: Allergy & Immunology

## 2022-06-05 ENCOUNTER — Encounter: Payer: No Typology Code available for payment source | Admitting: Nurse Practitioner

## 2022-06-05 ENCOUNTER — Encounter: Payer: Self-pay | Admitting: Internal Medicine

## 2022-06-05 ENCOUNTER — Ambulatory Visit: Payer: No Typology Code available for payment source | Admitting: Internal Medicine

## 2022-06-05 ENCOUNTER — Other Ambulatory Visit (HOSPITAL_COMMUNITY): Payer: Self-pay

## 2022-06-05 VITALS — BP 142/80 | HR 80 | Temp 98.7°F | Resp 18

## 2022-06-05 DIAGNOSIS — H1013 Acute atopic conjunctivitis, bilateral: Secondary | ICD-10-CM

## 2022-06-05 DIAGNOSIS — J302 Other seasonal allergic rhinitis: Secondary | ICD-10-CM

## 2022-06-05 DIAGNOSIS — K9049 Malabsorption due to intolerance, not elsewhere classified: Secondary | ICD-10-CM | POA: Diagnosis not present

## 2022-06-05 DIAGNOSIS — J3089 Other allergic rhinitis: Secondary | ICD-10-CM | POA: Diagnosis not present

## 2022-06-05 MED ORDER — CETIRIZINE HCL 10 MG PO TABS
10.0000 mg | ORAL_TABLET | Freq: Every day | ORAL | 3 refills | Status: AC
  Filled 2022-06-05: qty 30, 30d supply, fill #0

## 2022-06-05 MED ORDER — FLUTICASONE PROPIONATE 50 MCG/ACT NA SUSP
2.0000 | Freq: Every day | NASAL | 3 refills | Status: AC
  Filled 2022-06-05: qty 16, 30d supply, fill #0
  Filled 2022-06-30: qty 16, 30d supply, fill #1
  Filled 2022-11-27: qty 16, 30d supply, fill #2

## 2022-06-05 MED ORDER — OLOPATADINE HCL 0.2 % OP SOLN
1.0000 [drp] | Freq: Every day | OPHTHALMIC | 3 refills | Status: AC | PRN
  Filled 2022-06-05: qty 2.5, 50d supply, fill #0

## 2022-06-05 MED ORDER — OLOPATADINE HCL 0.6 % NA SOLN
2.0000 | Freq: Two times a day (BID) | NASAL | 3 refills | Status: AC
  Filled 2022-06-05 – 2022-06-15 (×3): qty 30.5, 30d supply, fill #0

## 2022-06-05 MED ORDER — MONTELUKAST SODIUM 10 MG PO TABS
10.0000 mg | ORAL_TABLET | Freq: Every day | ORAL | 3 refills | Status: DC
  Filled 2022-06-05: qty 30, 30d supply, fill #0

## 2022-06-05 NOTE — Patient Instructions (Addendum)
Rhinitis: - Use nasal saline rinses before nose sprays such as with Neilmed Sinus Rinse.  Use distilled water.   - Use Flonase 2 sprays each nostril daily. Aim upward and outward. - Use Olopatadine 2 sprays each nostril twice daily. Aim upward and outward. - Use Zyrtec 10 mg daily.  - Use Singulair '10mg'$  daily.   - For eyes, use Olopatadine or Ketotifen 1 eye drops daily as needed for itchy watery eyes.  Also Available over the counter, if not covered by insurance.  - Consider allergy shots as long term control of your symptoms by teaching your immune system to be more tolerant of your allergy triggers  Food Intolerance: - Low suspicion for IgE mediated food allergy.  Recommend avoidance of foods that trigger problems such as pineapples or MSG in take out.

## 2022-06-05 NOTE — Progress Notes (Addendum)
FOLLOW UP Date of Service/Encounter:  06/05/22   Subjective:  [name] (DOB: Jun 07, 1971) is a 51 y.o. female who returns to the [facility] on 06/05/2022 for follow up for allergic rhinitis, food intolerance, SOB.  History obtained from: chart review and patient.  Allergic Rhinitis She still reports frequent symptoms of congestion, drainage, sore throat, itchy watery eyes, fatigue.  She sometimes uses nasal rinses.  She was given samples of Ryaltris at last visit but has ran out and her insurance would not cover it.  We did send in separate Flonase and Olopatadine but she said the pharmacy never gave her the Olopatadine.  She is currently taking Zyrtec 10 mg daily, Singulair 10 mg daily, Flonase 2 SEN daily. She has an air purifier which helps her symptoms.  She feels shortness of breath is related because she is so congested.     Food Intolerance She still reports burning of the roof of her mouth with eating pineapples or [place] takeout.  Denies any other symptoms of hives, swelling, SOB/wheezing, coughing with the foods.     Past Medical History: Past Medical History:  Diagnosis Date   Anemia    Chronic allergic rhinitis    COVID 01-05-2021 result in epic   fatigue x 3 days   Elevated blood-pressure reading without diagnosis of hypertension    per pt being monitored by pcp, pt feels gets elevated in doctor office and with anxiety    (11-10-2019 cardiac CT showed normal coronaries and cal score zero;  echo 10-20-2019  ef 55%,G1DD, mild LVH, mild MR)   GAD (generalized anxiety disorder)    GERD (gastroesophageal reflux disease)    Hypothyroidism    Iron deficiency anemia secondary to blood loss (chronic) hematology/ onocology--- dr Delton Coombes   secondary to uterine fibroids,  treated with oral iron and iron infusions   Menorrhagia    Panic attack    Tinnitus of left ear    chronic   Uterine fibroid    Vitamin D deficiency    Wears glasses      Objective:  BP (!) 142/80   Pulse 80   Temp 98.7 F (37.1 C)   Resp 18   SpO2 97%  There is no height or weight on file to calculate BMI. Physical Exam: GEN: alert, well developed HEENT: clear conjunctiva, TM grey and translucent, nose with moderate inferior turbinate hypertrophy, pale nasal mucosa, clear rhinorrhea, + cobblestoning HEART: regular rate and rhythm, no murmur LUNGS: clear to auscultation bilaterally, no coughing, unlabored respiration SKIN: no rashes or lesions  Data Reviewed:  SPT: 02/2022: positive to grasses, ragweed, trees, indoor molds, outdoor molds, and cockroach.    Assessment/Plan   Allergic Rhinitis Allergic Conjunctivitis  - Remains uncontrolled, will try to maximize medical management at this time with the regimen as below.  - Use nasal saline rinses before nose sprays such as with Neilmed Sinus Rinse.  Use distilled water.   - Use Flonase 2 sprays each nostril daily. Aim upward and outward. - Use Olopatadine 2 sprays each nostril twice daily. Aim upward and outward.  Resent prescriptions to ensure she gets it from pharmacy. - Use Zyrtec 10 mg daily.  - Use Singulair '10mg'$  daily.   - For eyes, use Olopatadine or Ketotifen 1 eye drops daily as needed for itchy watery eyes.  Also Available over the counter, if not covered by insurance.  - Consider allergy shots as long term control of your symptoms by teaching your immune  system to be more tolerant of your allergy triggers  Food Intolerance: - Low suspicion for IgE mediated food allergy.  Recommend avoidance of foods that trigger problems such as pineapples or MSG in take out.    Return in about 3 months (around 09/04/2022). Harlon Flor, MD  [facility]

## 2022-06-06 NOTE — Progress Notes (Signed)
Chief Complaint:   OBESITY [name] is here to discuss her progress with her obesity treatment plan along with follow-up of her obesity related diagnoses. [name] is on the Category 2 Plan and states she is following her eating plan approximately 70% of the time. [name] states she is walking for 30 minutes 3 times per week.  Today's visit was #: 4 Starting weight: 235 lbs Starting date: 03/29/2022 Today's weight: 234 lbs Today's date: 05/30/2022 Total lbs lost to date: 1 Total lbs lost since last in-office visit: 0  Interim History: [name] has struggled with following her plan and her stress has been elevated. She especially struggles with dinner.  Subjective:   1. Vitamin D deficiency [name] is on Vitamin D, but her level is not yet at goal. She notes fatigue.   2. Gastroesophageal reflux disease, unspecified whether esophagitis present [name] is working on her weight loss, but she has significant symptoms, improved with Nexium. She has concerns about straying on PPI.   Assessment/Plan:   1. Vitamin D deficiency [name] will continue OTC multivitamins and prescription Vitamin D, and we will refill for 1 month. She will follow-up for routine testing of Vitamin D, at least 2-3 times per year to avoid over-replacement.  - Vitamin D, Ergocalciferol, (DRISDOL) 1.25 MG (50000 UNIT) CAPS capsule; Take 1 capsule (50,000 Units total) by mouth every 7 (seven) days.  Dispense: 4 capsule; Refill: 0  2. Gastroesophageal reflux disease, unspecified whether esophagitis present [name] will continue with her weight loss and continue Nexium OTC. She will discontinue when she loses enough weight to not need medications anymore.   3. Obesity, Current BMI 42.9 [name] is currently in the action stage of change. As such, her goal is to continue with weight loss efforts. She has agreed to the Category 2 Plan and keeping a food journal and adhering to recommended goals of 400-550 calories and 40+ grams of protein  at supper daily.   Exercise goals: As is.   Behavioral modification strategies: increasing lean protein intake.  [name] has agreed to follow-up with our clinic in 3 to 4 weeks. She was informed of the importance of frequent follow-up visits to maximize her success with intensive lifestyle modifications for her multiple health conditions.   Objective:   Blood pressure (!) 150/86, pulse 86, temperature 98.2 F (36.8 C), height '5\' 2"'$  (1.575 m), weight 234 lb (106.1 kg), SpO2 100 %. Body mass index is 42.8 kg/m.  General: Cooperative, alert, well developed, in no acute distress. HEENT: Conjunctivae and lids unremarkable. Cardiovascular: Regular rhythm.  Lungs: Normal work of breathing. Neurologic: No focal deficits.   Lab Results  Component Value Date   CREATININE 0.86 03/29/2022   BUN 13 03/29/2022   NA 143 03/29/2022   K 4.3 03/29/2022   CL 104 03/29/2022   CO2 21 03/29/2022   Lab Results  Component Value Date   ALT 20 03/29/2022   AST 15 03/29/2022   ALKPHOS 70 03/29/2022   BILITOT 0.3 03/29/2022   Lab Results  Component Value Date   HGBA1C 5.3 03/29/2022   HGBA1C 5.1 07/04/2021   Lab Results  Component Value Date   INSULIN 16.7 03/29/2022   Lab Results  Component Value Date   TSH 1.470 03/29/2022   Lab Results  Component Value Date   CHOL 176 03/29/2022   HDL 63 03/29/2022   LDLCALC 101 (H) 03/29/2022   TRIG 63 03/29/2022   CHOLHDL 2.6 07/04/2021   Lab Results  Component Value  Date   VD25OH 32.4 03/29/2022   VD25OH 28 (L) 12/04/2016   Lab Results  Component Value Date   WBC 7.0 11/28/2021   HGB 13.9 11/28/2021   HCT 48.1 (H) 03/29/2022   MCV 88 11/28/2021   PLT 348 11/28/2021   Lab Results  Component Value Date   IRON 96 03/29/2022   TIBC 329 03/29/2022   FERRITIN 76 03/29/2022   Attestation Statements:   Reviewed by clinician on day of visit: allergies, medications, problem list, medical history, surgical history, family history, social  history, and previous encounter notes.   I, Trixie Dredge, am acting as transcriptionist for Dennard Nip, MD.  I have reviewed the above documentation for accuracy and completeness, and I agree with the above. -  Dennard Nip, MD

## 2022-06-15 ENCOUNTER — Other Ambulatory Visit (HOSPITAL_COMMUNITY): Payer: Self-pay

## 2022-06-15 ENCOUNTER — Encounter (HOSPITAL_COMMUNITY): Payer: Self-pay | Admitting: Hematology and Oncology

## 2022-06-16 ENCOUNTER — Other Ambulatory Visit (HOSPITAL_COMMUNITY): Payer: Self-pay

## 2022-06-16 ENCOUNTER — Telehealth: Payer: Self-pay | Admitting: Internal Medicine

## 2022-06-16 MED ORDER — AZELASTINE HCL 0.1 % NA SOLN
2.0000 | Freq: Two times a day (BID) | NASAL | 3 refills | Status: AC
  Filled 2022-06-16: qty 30, 25d supply, fill #0

## 2022-06-16 NOTE — Telephone Encounter (Signed)
Will send in Azelastine as Olopatadine is not covered by insurance.

## 2022-06-21 ENCOUNTER — Encounter: Payer: Self-pay | Admitting: Nurse Practitioner

## 2022-06-26 ENCOUNTER — Encounter (INDEPENDENT_AMBULATORY_CARE_PROVIDER_SITE_OTHER): Payer: Self-pay | Admitting: Family Medicine

## 2022-06-26 ENCOUNTER — Ambulatory Visit (INDEPENDENT_AMBULATORY_CARE_PROVIDER_SITE_OTHER): Payer: No Typology Code available for payment source | Admitting: Family Medicine

## 2022-06-26 VITALS — BP 152/85 | HR 83 | Temp 98.1°F | Ht 62.0 in | Wt 235.0 lb

## 2022-06-26 DIAGNOSIS — R030 Elevated blood-pressure reading, without diagnosis of hypertension: Secondary | ICD-10-CM | POA: Diagnosis not present

## 2022-06-26 DIAGNOSIS — Z6841 Body Mass Index (BMI) 40.0 and over, adult: Secondary | ICD-10-CM | POA: Diagnosis not present

## 2022-06-26 DIAGNOSIS — E669 Obesity, unspecified: Secondary | ICD-10-CM | POA: Diagnosis not present

## 2022-06-26 DIAGNOSIS — E559 Vitamin D deficiency, unspecified: Secondary | ICD-10-CM | POA: Diagnosis not present

## 2022-06-26 MED ORDER — BD PEN NEEDLE NANO U/F 32G X 4 MM MISC
Freq: Every day | 0 refills | Status: AC
  Filled 2022-06-26 – 2022-07-06 (×2): qty 100, 90d supply, fill #0

## 2022-06-26 MED ORDER — SAXENDA 18 MG/3ML ~~LOC~~ SOPN
3.0000 mg | PEN_INJECTOR | Freq: Every day | SUBCUTANEOUS | 0 refills | Status: DC
  Filled 2022-06-26 – 2022-07-05 (×3): qty 15, 30d supply, fill #0

## 2022-06-26 MED ORDER — VITAMIN D (ERGOCALCIFEROL) 1.25 MG (50000 UNIT) PO CAPS
50000.0000 [IU] | ORAL_CAPSULE | ORAL | 0 refills | Status: AC
  Filled 2022-06-26: qty 4, 28d supply, fill #0

## 2022-06-27 ENCOUNTER — Encounter (HOSPITAL_COMMUNITY): Payer: Self-pay | Admitting: Hematology and Oncology

## 2022-06-27 ENCOUNTER — Other Ambulatory Visit (HOSPITAL_COMMUNITY): Payer: Self-pay

## 2022-06-30 ENCOUNTER — Encounter (INDEPENDENT_AMBULATORY_CARE_PROVIDER_SITE_OTHER): Payer: Self-pay | Admitting: Family Medicine

## 2022-06-30 ENCOUNTER — Other Ambulatory Visit (HOSPITAL_COMMUNITY): Payer: Self-pay

## 2022-07-03 ENCOUNTER — Other Ambulatory Visit (HOSPITAL_COMMUNITY): Payer: Self-pay

## 2022-07-03 NOTE — Progress Notes (Signed)
Chief Complaint:   OBESITY [name] is here to discuss her progress with her obesity treatment plan along with follow-up of her obesity related diagnoses. [name] is on the Category 2 Plan and keeping a food journal and adhering to recommended goals of 400-550 calories and 40+ grams of protein at supper daily and states she is following her eating plan approximately 70% of the time. [name] states she is walking for 30 minutes 3 times per week.  Today's visit was #: 5 Starting weight: 235 lbs Starting date: 03/29/2022 Today's weight: 235 lbs Today's date: 06/26/2022 Total lbs lost to date: 0 Total lbs lost since last in-office visit: 0  Interim History: [name] has struggled with stress eating and celebration eating on vacation. She had Saxenda prescribed by her PCP previously, but she hasn't started it yet.   Subjective:   1. Vitamin D deficiency [name] is on Vitamin D, with no side effects noted. She level is not yet at goal.   2. Elevated blood pressure reading without diagnosis of hypertension [name] has increased stress with work and taxes, normally her blood pressure is at goal.   Assessment/Plan:   1. Vitamin D deficiency We will refill prescription Vitamin D for 1 month. [place] will follow-up for routine testing of Vitamin D, at least 2-3 times per year to avoid over-replacement.  - Vitamin D, Ergocalciferol, (DRISDOL) 1.25 MG (50000 UNIT) CAPS capsule; Take 1 capsule (50,000 Units total) by mouth every 7 (seven) days.  Dispense: 4 capsule; Refill: 0  2. Elevated blood pressure reading without diagnosis of hypertension [place] will continue with her diet and exercise, and she is to check her blood pressure at home.  3. Obesity, Current BMI 43.1 [name] is currently in the action stage of change. As such, her goal is to continue with weight loss efforts. She has agreed to the Category 2 Plan.   [name] agreed to start Saxenda 3 mg once daily with no refills (patient is to start at 0.6  mg and no increase in dose until her follow-up visit); pen needles #100 with no refills.   - Liraglutide -Weight Management (SAXENDA) 18 MG/3ML SOPN; Inject 3 mg into the skin daily.  Dispense: 15 mL; Refill: 0 - Insulin Pen Needle (BD PEN NEEDLE NANO U/F) 32G X 4 MM MISC; Use once daily.  Dispense: 100 each; Refill: 0  Exercise goals: As is.   Behavioral modification strategies: increasing lean protein intake and no skipping meals.  [name] has agreed to follow-up with our clinic in 3 weeks. She was informed of the importance of frequent follow-up visits to maximize her success with intensive lifestyle modifications for her multiple health conditions.   Objective:   Blood pressure (!) 152/85, pulse 83, temperature 98.1 F (36.7 C), height '5\' 2"'$  (1.575 m), weight 235 lb (106.6 kg), SpO2 99 %. Body mass index is 42.98 kg/m.  General: Cooperative, alert, well developed, in no acute distress. HEENT: Conjunctivae and lids unremarkable. Cardiovascular: Regular rhythm.  Lungs: Normal work of breathing. Neurologic: No focal deficits.   Lab Results  Component Value Date   CREATININE 0.86 03/29/2022   BUN 13 03/29/2022   NA 143 03/29/2022   K 4.3 03/29/2022   CL 104 03/29/2022   CO2 21 03/29/2022   Lab Results  Component Value Date   ALT 20 03/29/2022   AST 15 03/29/2022   ALKPHOS 70 03/29/2022   BILITOT 0.3 03/29/2022   Lab Results  Component Value Date   HGBA1C 5.3  03/29/2022   HGBA1C 5.1 07/04/2021   Lab Results  Component Value Date   INSULIN 16.7 03/29/2022   Lab Results  Component Value Date   TSH 1.470 03/29/2022   Lab Results  Component Value Date   CHOL 176 03/29/2022   HDL 63 03/29/2022   LDLCALC 101 (H) 03/29/2022   TRIG 63 03/29/2022   CHOLHDL 2.6 07/04/2021   Lab Results  Component Value Date   VD25OH 32.4 03/29/2022   VD25OH 28 (L) 12/04/2016   Lab Results  Component Value Date   WBC 7.0 11/28/2021   HGB 13.9 11/28/2021   HCT 48.1 (H)  03/29/2022   MCV 88 11/28/2021   PLT 348 11/28/2021   Lab Results  Component Value Date   IRON 96 03/29/2022   TIBC 329 03/29/2022   FERRITIN 76 03/29/2022   Attestation Statements:   Reviewed by clinician on day of visit: allergies, medications, problem list, medical history, surgical history, family history, social history, and previous encounter notes.   I, Trixie Dredge, am acting as transcriptionist for Dennard Nip, MD.  I have reviewed the above documentation for accuracy and completeness, and I agree with the above. -  Dennard Nip, MD

## 2022-07-05 ENCOUNTER — Telehealth (INDEPENDENT_AMBULATORY_CARE_PROVIDER_SITE_OTHER): Payer: Self-pay | Admitting: Family Medicine

## 2022-07-05 ENCOUNTER — Encounter (INDEPENDENT_AMBULATORY_CARE_PROVIDER_SITE_OTHER): Payer: Self-pay

## 2022-07-05 ENCOUNTER — Other Ambulatory Visit (HOSPITAL_COMMUNITY): Payer: Self-pay

## 2022-07-05 NOTE — Telephone Encounter (Signed)
Dr. Leafy Ro - Prior authorization approved for Saxenda. Effective: 07/04/2022 to 10/24/2022. Patient sent approval message via mychart.

## 2022-07-06 ENCOUNTER — Encounter (HOSPITAL_COMMUNITY): Payer: Self-pay | Admitting: Hematology and Oncology

## 2022-07-06 ENCOUNTER — Other Ambulatory Visit (HOSPITAL_COMMUNITY): Payer: Self-pay

## 2022-07-06 MED ORDER — INFLUENZA VAC SPLIT QUAD 0.5 ML IM SUSY
0.5000 mL | PREFILLED_SYRINGE | INTRAMUSCULAR | 0 refills | Status: AC
  Filled 2022-07-06: qty 0.5, 1d supply, fill #0

## 2022-07-10 ENCOUNTER — Ambulatory Visit (INDEPENDENT_AMBULATORY_CARE_PROVIDER_SITE_OTHER): Payer: No Typology Code available for payment source | Admitting: Family Medicine

## 2022-07-10 ENCOUNTER — Encounter: Payer: Self-pay | Admitting: Family Medicine

## 2022-07-10 VITALS — BP 140/70 | HR 89 | Temp 98.1°F | Ht 62.0 in | Wt 238.0 lb

## 2022-07-10 DIAGNOSIS — K219 Gastro-esophageal reflux disease without esophagitis: Secondary | ICD-10-CM | POA: Diagnosis not present

## 2022-07-10 DIAGNOSIS — Z0000 Encounter for general adult medical examination without abnormal findings: Secondary | ICD-10-CM

## 2022-07-10 DIAGNOSIS — J309 Allergic rhinitis, unspecified: Secondary | ICD-10-CM

## 2022-07-10 DIAGNOSIS — R030 Elevated blood-pressure reading, without diagnosis of hypertension: Secondary | ICD-10-CM | POA: Diagnosis not present

## 2022-07-10 DIAGNOSIS — Z6841 Body Mass Index (BMI) 40.0 and over, adult: Secondary | ICD-10-CM

## 2022-07-10 DIAGNOSIS — E559 Vitamin D deficiency, unspecified: Secondary | ICD-10-CM

## 2022-07-10 DIAGNOSIS — E039 Hypothyroidism, unspecified: Secondary | ICD-10-CM | POA: Diagnosis not present

## 2022-07-10 DIAGNOSIS — F411 Generalized anxiety disorder: Secondary | ICD-10-CM

## 2022-07-10 NOTE — Progress Notes (Signed)
New Patient Office Visit  Subjective    Patient ID: [name], female    DOB: 1970/09/28  Age: 51 y.o. MRN: [id number]  CC:  Chief Complaint  Patient presents with   Establish Care    Want to wt off and poss get wt meds to help     HPI [name] presents to establish care. Oriented to practice routines and expectations. Concerns today include none. She is starting [place] this week for weight loss with MWM. She was reluctant due to get GERD. She eats a low carb, high protein diet. Planning to start 30 minutes per day this week on treadmill.  She has a PMH of GERD well controlled on Nexium '40mg'$ , Vitamin D deficiency on Vitamin D supplementation, hypothyroidism on Synthroid, and anxiety well controlled on Zoloft '50mg'$  daily.  Planned PAP and mammogram with Dr Garwin Brothers, periods are irregular and last 5 days without severe symptoms. History of uterine fibroids and ablations. She is followed by Dr Garwin Brothers for GYN needs Cologuard completed Denies tobacco, alcohol, or drug  use   Outpatient Encounter Medications as of 07/10/2022  Medication Sig   APPLE CIDER VINEGAR PO [place] apple cider vinegar   azelastine (ASTELIN) 0.1 % nasal spray Place 2 sprays into both nostrils 2 (two) times daily. Use in each nostril as directed   cetirizine (ZYRTEC) 10 MG tablet Take 1 tablet (10 mg total) by mouth daily.   Cholecalciferol (VITAMIN D3) 50 MCG (2000 UT) CHEW Chew 2 capsules by mouth daily.   esomeprazole (NEXIUM) 40 MG capsule Take 40 mg by mouth daily at 12 noon.   fluticasone (FLONASE) 50 MCG/ACT nasal spray Place 2 sprays into both nostrils daily.   ibuprofen (ADVIL) 800 MG tablet Take 1 tablet (800 mg total) by mouth every 8 (eight) hours as needed for moderate pain.   influenza vac split quadrivalent PF (FLUARIX) 0.5 ML injection Inject 0.5 mLs into the muscle.   Insulin Pen Needle (BD PEN NEEDLE NANO U/F) 32G X 4 MM MISC Use once daily.   Lactobacillus Rhamnosus, GG, (CULTURELLE  PO) Take 1 tablet by mouth daily.   levothyroxine (SYNTHROID) 25 MCG tablet Take 1 tablet (25 mcg total) by mouth daily.   Liraglutide -Weight Management (SAXENDA) 18 MG/3ML SOPN Inject 3 mg into the skin daily.   montelukast (SINGULAIR) 10 MG tablet Take 1 tablet (10 mg total) by mouth at bedtime.   Multiple Vitamins-Iron (MULTIVITAMINS WITH IRON) TABS tablet Take 1 tablet by mouth daily.   OVER THE COUNTER MEDICATION 2 beets gummies daily   OVER THE COUNTER MEDICATION Tumeric   OVER THE COUNTER MEDICATION Rotates allegra,Claritin, and zyrtec   sertraline (ZOLOFT) 50 MG tablet Take 50 mg by mouth daily.   Vitamin D, Ergocalciferol, (DRISDOL) 1.25 MG (50000 UNIT) CAPS capsule Take 1 capsule (50,000 Units total) by mouth every 7 (seven) days.   Zoster Vaccine Adjuvanted [facility]) injection Inject 0.5 mLs into the muscle once for 1 dose   famotidine (PEPCID) 20 MG tablet Take 1 tablet (20 mg total) by mouth 2 (two) times daily. (Patient not taking: Reported on 07/10/2022)   naproxen (NAPROSYN) 500 MG tablet Take 1 tablet (500 mg total) by mouth 2 (two) times daily with a meal. During menses (Patient not taking: Reported on 07/10/2022)   Olopatadine HCl 0.2 % SOLN Apply 1 drop to eye daily as needed (itchy watery eyes).   Olopatadine HCl 0.6 % SOLN Place 2 sprays into the nose 2 (two) times  daily.   Olopatadine-Mometasone 161-09 MCG/ACT SUSP Place 2 sprays into the nose 2 (two) times daily as needed. (Patient not taking: Reported on 07/10/2022)   [DISCONTINUED] esomeprazole (NEXIUM) 40 MG capsule Take 1 capsule (40 mg total) by mouth daily.   [DISCONTINUED] sertraline (ZOLOFT) 100 MG tablet Take 1 tablet (100 mg total) by mouth at bedtime.   No facility-administered encounter medications on file as of 07/10/2022.    Past Medical History:  Diagnosis Date   Anemia    Chronic allergic rhinitis    COVID 01-05-2021 result in epic   fatigue x 3 days   Elevated blood-pressure reading without  diagnosis of hypertension    per pt being monitored by pcp, pt feels gets elevated in doctor office and with anxiety    (11-10-2019 cardiac CT showed normal coronaries and cal score zero;  echo 10-20-2019  ef 55%,G1DD, mild LVH, mild MR)   GAD (generalized anxiety disorder)    GERD (gastroesophageal reflux disease)    Hypothyroidism    Iron deficiency anemia secondary to blood loss (chronic) hematology/ onocology--- dr Delton Coombes   secondary to uterine fibroids,  treated with oral iron and iron infusions   Menorrhagia    Panic attack    Tinnitus of left ear    chronic   Uterine fibroid    Vitamin D deficiency    Wears glasses     Past Surgical History:  Procedure Laterality Date   BREAST BIOPSY     CERVICAL CERCLAGE     CESAREAN SECTION     HYSTEROSCOPY WITH RESECTOSCOPE N/A 02/11/2021   Procedure: HYSTEROSCOPY WITH MYOSURE;  Surgeon: Servando Salina, MD;  Location: [facility];  Service: Gynecology;  Laterality: N/A;    Family History  Problem Relation Age of Onset   Allergic rhinitis Mother    Arthritis Mother    Hypertension Mother    Diabetes Mother    High Cholesterol Mother    Allergic rhinitis Father    Hyperlipidemia Father    Hypertension Father    Diabetes Father    Allergic rhinitis Brother    Arthritis Maternal Grandmother    Stroke Maternal Grandfather    Diabetes Paternal Grandmother    Heart disease Paternal Grandfather    Cancer Maternal Aunt        breast   Breast cancer Maternal Aunt 58   Cancer Paternal Aunt        breast   Breast cancer Paternal Aunt 27   Breast cancer Paternal Aunt 77   Asthma Niece     Social History   Socioeconomic History   Marital status: Married    Spouse name: Not on file   Number of children: 0   Years of education: Not on file   Highest education level: Not on file  Occupational History    Employer: [employer]  Tobacco Use   Smoking status: Never    Passive exposure: Never   Smokeless  tobacco: Never  Vaping Use   Vaping Use: Never used  Substance and Sexual Activity   Alcohol use: Yes    Comment: occasional   Drug use: Never   Sexual activity: Yes    Birth control/protection: Condom  Other Topics Concern   Not on file  Social History Narrative   Not on file   Social Determinants of Health   Financial Resource Strain: Low Risk  (08/18/2020)   Overall Financial Resource Strain (CARDIA)    Difficulty of Paying Living Expenses: Not hard at  all  Food Insecurity: No Food Insecurity (08/18/2020)   Hunger Vital Sign    Worried About Running Out of Food in the Last Year: Never true    Ran Out of Food in the Last Year: Never true  Transportation Needs: No Transportation Needs (08/18/2020)   PRAPARE - Hydrologist (Medical): No    Lack of Transportation (Non-Medical): No  Physical Activity: Sufficiently Active (08/18/2020)   Exercise Vital Sign    Days of Exercise per Week: 3 days    Minutes of Exercise per Session: 120 min  Stress: No Stress Concern Present (08/18/2020)   [place]    Feeling of Stress : Only a little  Social Connections: Socially Integrated (08/18/2020)   Social Connection and Isolation Panel [NHANES]    Frequency of Communication with Friends and Family: More than three times a week    Frequency of Social Gatherings with Friends and Family: Twice a week    Attends Religious Services: More than 4 times per year    Active Member of [employer] or Organizations: Yes    Attends Music therapist: More than 4 times per year    Marital Status: Married  Human resources officer Violence: Not At Risk (08/18/2020)   Humiliation, Afraid, Rape, and Kick questionnaire    Fear of Current or Ex-Partner: No    Emotionally Abused: No    Physically Abused: No    Sexually Abused: No    Review of Systems  Constitutional: Negative.   HENT:  Positive for sinus pain.    Eyes: Negative.   Respiratory: Negative.    Cardiovascular: Negative.   Gastrointestinal:  Positive for heartburn.  Genitourinary: Negative.   Musculoskeletal: Negative.   Skin: Negative.   Neurological:  Positive for headaches.  Endo/Heme/Allergies: Negative.   Psychiatric/Behavioral: Negative.    All other systems reviewed and are negative.       Objective    BP (!) 140/70   Pulse 89   Temp 98.1 F (36.7 C) (Oral)   Ht '5\' 2"'$  (1.575 m)   Wt 238 lb (108 kg)   LMP 06/05/2022 (Approximate)   SpO2 99%   BMI 43.53 kg/m   BP Readings from Last 3 Encounters:  07/10/22 (!) 140/70  06/26/22 (!) 152/85  06/05/22 (!) 142/80     Physical Exam Vitals and nursing note reviewed.  Constitutional:      Appearance: Normal appearance. She is normal weight.  HENT:     Head: Normocephalic and atraumatic.     Right Ear: There is impacted cerumen.     Left Ear: Tympanic membrane, ear canal and external ear normal.     Nose: Nose normal.     Mouth/Throat:     Mouth: Mucous membranes are moist.     Pharynx: Oropharynx is clear.  Eyes:     Extraocular Movements: Extraocular movements intact.     Conjunctiva/sclera: Conjunctivae normal.     Pupils: Pupils are equal, round, and reactive to light.  Cardiovascular:     Rate and Rhythm: Normal rate and regular rhythm.     Pulses: Normal pulses.     Heart sounds: Normal heart sounds.  Pulmonary:     Effort: Pulmonary effort is normal.     Breath sounds: Normal breath sounds.  Abdominal:     General: Bowel sounds are normal.     Palpations: Abdomen is soft.  Musculoskeletal:  General: Normal range of motion.     Cervical back: Normal range of motion and neck supple.  Skin:    General: Skin is warm and dry.     Capillary Refill: Capillary refill takes less than 2 seconds.  Neurological:     General: No focal deficit present.     Mental Status: She is alert and oriented to person, place, and time. Mental status is at  baseline.  Psychiatric:        Mood and Affect: Mood normal.        Behavior: Behavior normal.        Thought Content: Thought content normal.        Judgment: Judgment normal.        Assessment & Plan:  1. Physical exam, annual Routine physical done today, labs up to days as of July. No concerns to address today. Discussed health maintenance and importance of weight loss for her overall health. I am proud of her for being motivated. Start 150 minutes of exercise weekly and maintain a heart healthy diet.  2. Elevated blood pressure reading without diagnosis of hypertension Check home BP readings for 3 days and report to office via [place]. We discussed the importance of healthy lifestyle and weight loss for overall health but she may need an antihypertensive if home readings are also elevated as she has had readings >140/90 at 3 recent visits.  3. Vitamin D deficiency Continue Vitamin D3 and Vitamin D weekly injections. Last level 32.4.  4. Gastroesophageal reflux disease, unspecified whether esophagitis present Continue Nexium '40mg'$  OTC. Avoid caffeine, smoking, alcohol, NSAIDS, high fat meals, large meals, and eating close to bedtime.  5. Hypothyroidism, unspecified type Continue Synthroid. Last TSH 1.470. No red flag symptoms.  6. GAD (generalized anxiety disorder) Continue Zoloft '50mg'$  daily as prescribed. Seek medical attention for uncontrolled anxiety or suicidal ideations.  7. Chronic allergic rhinitis  8. Class 3 severe obesity with body mass index (BMI) of 40.0 to 44.9 in adult, unspecified obesity type, unspecified whether serious comorbidity present [facility]) Working with medical weight management. Initiating 30 minutes of exercise daily, low carb-high protein diet, and Saxenda with close MWM monitoring.   Return in about 1 week (around 07/17/2022) for BP check.   Rubie Maid, FNP

## 2022-07-10 NOTE — Patient Instructions (Signed)
Please monitor blood pressure at home for 3 days in the AM and report reading via MyChart. Send cologuard results

## 2022-07-11 ENCOUNTER — Telehealth: Payer: Self-pay

## 2022-07-11 NOTE — Telephone Encounter (Signed)
PA-Saxenda sent to plan

## 2022-07-17 ENCOUNTER — Encounter: Payer: Self-pay | Admitting: Family Medicine

## 2022-07-17 ENCOUNTER — Ambulatory Visit (INDEPENDENT_AMBULATORY_CARE_PROVIDER_SITE_OTHER): Payer: No Typology Code available for payment source | Admitting: Family Medicine

## 2022-07-17 VITALS — BP 130/72 | HR 72 | Temp 98.1°F | Ht 62.0 in | Wt 236.0 lb

## 2022-07-17 DIAGNOSIS — R030 Elevated blood-pressure reading, without diagnosis of hypertension: Secondary | ICD-10-CM | POA: Diagnosis not present

## 2022-07-17 NOTE — Progress Notes (Signed)
   Acute Office Visit  Subjective:     Patient ID: [name], female    DOB: 09-Jul-1971, 51 y.o.   MRN: [id number]  Chief Complaint  Patient presents with   Follow-up    F/u BP    HPI Patient is in today for BP follow-up. Home readings were 149/87 (11/1), 145/87 (11/2), and 145/75 (11/3). Today her BP is much improved at 130/72. She has been exercising 30 minutes daily on the treadmill. She has also improved her diet.  Review of Systems  Respiratory: Negative.    Cardiovascular: Negative.   Gastrointestinal: Negative.   Neurological: Negative.   Psychiatric/Behavioral: Negative.    All other systems reviewed and are negative.       Objective:    BP 130/72   Pulse 72   Temp 98.1 F (36.7 C) (Oral)   Ht '5\' 2"'$  (1.575 m)   Wt 236 lb (107 kg)   LMP 07/15/2022 (Approximate) Comment: per pt it has been of and off  SpO2 100%   BMI 43.16 kg/m  BP Readings from Last 3 Encounters:  07/17/22 130/72  07/10/22 (!) 140/70  06/26/22 (!) 152/85      Physical Exam Vitals and nursing note reviewed.  Constitutional:      Appearance: Normal appearance. She is obese.  HENT:     Head: Normocephalic and atraumatic.  Neck:     Vascular: No carotid bruit.  Cardiovascular:     Rate and Rhythm: Normal rate and regular rhythm.     Pulses: Normal pulses.     Heart sounds: Normal heart sounds.  Skin:    General: Skin is warm and dry.  Neurological:     General: No focal deficit present.     Mental Status: She is alert and oriented to person, place, and time. Mental status is at baseline.  Psychiatric:        Mood and Affect: Mood normal.        Behavior: Behavior normal.        Thought Content: Thought content normal.        Judgment: Judgment normal.     No results found for any visits on 07/17/22.      Assessment & Plan:   1. Elevated blood pressure reading without diagnosis of hypertension I am very proud of [name] for her motivation and lifestyle  modifications. She has started exercising and eating healthy and is very motivated to lose weight. Her BP reading today is improved, she will continue to monitor at home weekly and return to office if readings are elevated or experiencing symptoms such as headaches, dizziness, swelling, or vision changes. Encouraged to seek emergent medical care for chest pain or shortness of breath.    No orders of the defined types were placed in this encounter.   Return in about 6 months (around 01/15/2023).  Rubie Maid, FNP

## 2022-07-20 ENCOUNTER — Other Ambulatory Visit: Payer: Self-pay | Admitting: Family Medicine

## 2022-07-20 DIAGNOSIS — Z1231 Encounter for screening mammogram for malignant neoplasm of breast: Secondary | ICD-10-CM

## 2022-07-24 ENCOUNTER — Ambulatory Visit (INDEPENDENT_AMBULATORY_CARE_PROVIDER_SITE_OTHER): Payer: No Typology Code available for payment source | Admitting: Family Medicine

## 2022-07-25 NOTE — Telephone Encounter (Signed)
PA-Saxenda has been Denied 07/13/22

## 2022-07-26 ENCOUNTER — Other Ambulatory Visit (HOSPITAL_COMMUNITY): Payer: Self-pay

## 2022-08-02 ENCOUNTER — Ambulatory Visit
Admission: RE | Admit: 2022-08-02 | Discharge: 2022-08-02 | Disposition: A | Discharge status: Home / Self Care | Payer: No Typology Code available for payment source | Source: Ambulatory Visit | Attending: Family Medicine | Admitting: Family Medicine

## 2022-08-02 DIAGNOSIS — Z1231 Encounter for screening mammogram for malignant neoplasm of breast: Secondary | ICD-10-CM

## 2022-08-14 ENCOUNTER — Ambulatory Visit (INDEPENDENT_AMBULATORY_CARE_PROVIDER_SITE_OTHER): Payer: No Typology Code available for payment source | Admitting: Family Medicine

## 2022-08-21 ENCOUNTER — Encounter (HOSPITAL_COMMUNITY): Payer: Self-pay | Admitting: Hematology and Oncology

## 2022-08-24 NOTE — Progress Notes (Incomplete)
   [place], [place] [address] Dept: [phone]  FOLLOW UP NOTE  Patient ID: [name], female    DOB: 1971-02-13  Age: 51 y.o. MRN: [id number] Date of Office Visit: 08/25/2022  Assessment  Chief Complaint: No chief complaint on file.  HPI [name] is a 51 year old female who presents to the clinic for follow-up visit.  She was last seen in this clinic on 06/05/2022 by Dr. Posey Pronto for evaluation of allergic rhinitis, allergic conjunctivitis, and food intolerance.   Drug Allergies:  No Known Allergies  Physical Exam: There were no vitals taken for this visit.   Physical Exam  Diagnostics:    Assessment and Plan: No diagnosis found.  No orders of the defined types were placed in this encounter.   There are no Patient Instructions on file for this visit.  No follow-ups on file.    Thank you for the opportunity to care for this patient.  Please do not hesitate to contact me with questions.  Gareth Morgan, FNP [facility]

## 2022-08-25 ENCOUNTER — Ambulatory Visit: Payer: No Typology Code available for payment source | Admitting: Family Medicine

## 2022-08-26 ENCOUNTER — Encounter (HOSPITAL_COMMUNITY): Payer: Self-pay | Admitting: Hematology and Oncology

## 2022-09-14 ENCOUNTER — Encounter (HOSPITAL_COMMUNITY): Payer: Self-pay | Admitting: Hematology and Oncology

## 2022-09-14 ENCOUNTER — Other Ambulatory Visit (HOSPITAL_COMMUNITY): Payer: Self-pay

## 2022-10-27 ENCOUNTER — Other Ambulatory Visit: Payer: Self-pay | Admitting: Nurse Practitioner

## 2022-10-27 ENCOUNTER — Other Ambulatory Visit (HOSPITAL_COMMUNITY): Payer: Self-pay

## 2022-10-27 MED ORDER — LEVOTHYROXINE SODIUM 25 MCG PO TABS
25.0000 ug | ORAL_TABLET | Freq: Every day | ORAL | 1 refills | Status: DC
  Filled 2022-10-27: qty 90, 90d supply, fill #0
  Filled 2023-02-09: qty 90, 90d supply, fill #1

## 2022-11-03 ENCOUNTER — Other Ambulatory Visit (HOSPITAL_COMMUNITY): Payer: Self-pay

## 2022-11-07 DIAGNOSIS — K219 Gastro-esophageal reflux disease without esophagitis: Secondary | ICD-10-CM | POA: Diagnosis not present

## 2022-11-07 DIAGNOSIS — K760 Fatty (change of) liver, not elsewhere classified: Secondary | ICD-10-CM | POA: Diagnosis not present

## 2022-11-07 DIAGNOSIS — K645 Perianal venous thrombosis: Secondary | ICD-10-CM | POA: Diagnosis not present

## 2022-11-27 ENCOUNTER — Other Ambulatory Visit (HOSPITAL_COMMUNITY): Payer: Self-pay

## 2022-12-12 ENCOUNTER — Encounter (HOSPITAL_COMMUNITY): Payer: Self-pay | Admitting: Hematology and Oncology

## 2022-12-14 ENCOUNTER — Ambulatory Visit: Payer: 59 | Admitting: Family Medicine

## 2022-12-14 ENCOUNTER — Encounter: Payer: Self-pay | Admitting: Family Medicine

## 2022-12-14 VITALS — BP 142/92 | HR 68 | Temp 97.8°F | Ht 62.0 in | Wt 242.0 lb

## 2022-12-14 DIAGNOSIS — R030 Elevated blood-pressure reading, without diagnosis of hypertension: Secondary | ICD-10-CM | POA: Diagnosis not present

## 2022-12-14 DIAGNOSIS — F411 Generalized anxiety disorder: Secondary | ICD-10-CM

## 2022-12-14 NOTE — Assessment & Plan Note (Signed)
She endorses increase in stress and anxiety at home with 1 episode of panic attack. Will increase Zoloft to 100mg  daily and follow up on BP in 1 week.

## 2022-12-14 NOTE — Assessment & Plan Note (Signed)
BP 142/92 today, she does endorse increase stress and worry. Home readings reported as 163/88, 121/71, and 155/88 this AM. With her increase in anxiety I would like to increase her Zoloft to 100mg  daily and have her check her BP consistently in AM at home and return to office in 1 week. Instructed to seek medical care for BP>180/120, chest pain, palpitations, shortness of breath, swelling of extremities, vision changes, recurrent or severe headache.

## 2022-12-14 NOTE — Progress Notes (Signed)
Acute Office Visit  Subjective:     Patient ID: [name], female    DOB: 06/03/1971, 52 y.o.   MRN: [id number]  Chief Complaint  Patient presents with   Follow-up    Blood Pressure     HPI Patient is in today for blood pressure concerns, she had a panic attack at home Monday morning, she reports increase stress at home. She has not had a panic attack in years. BP was 163/88 at that time, 121/71 Tuesday and she did have a migraine, 155/88 the following day. Denies chest pain, palpitations, dizziness/lightheadedness, vision changes, recurrent headaches, or swelling of extremities. She has not been checking her BP at home prior to this week and she endorses high stress at home.   Review of Systems  All other systems reviewed and are negative.   Past Medical History:  Diagnosis Date   Anemia    Chronic allergic rhinitis    COVID 01-05-2021 result in epic   fatigue x 3 days   Elevated blood-pressure reading without diagnosis of hypertension    per pt being monitored by pcp, pt feels gets elevated in doctor office and with anxiety    (11-10-2019 cardiac CT showed normal coronaries and cal score zero;  echo 10-20-2019  ef 55%,G1DD, mild LVH, mild MR)   GAD (generalized anxiety disorder)    GERD (gastroesophageal reflux disease)    Hypothyroidism    Iron deficiency anemia secondary to blood loss (chronic) hematology/ onocology--- dr Delton Coombes   secondary to uterine fibroids,  treated with oral iron and iron infusions   Menorrhagia    Panic attack    Tinnitus of left ear    chronic   Uterine fibroid    Vitamin D deficiency    Wears glasses    Past Surgical History:  Procedure Laterality Date   BREAST BIOPSY     CERVICAL CERCLAGE     CESAREAN SECTION     HYSTEROSCOPY WITH RESECTOSCOPE N/A 02/11/2021   Procedure: HYSTEROSCOPY WITH MYOSURE;  Surgeon: Servando Salina, MD;  Location: [place];  Service: Gynecology;  Laterality: N/A;   Current  Outpatient Medications on File Prior to Visit  Medication Sig Dispense Refill   APPLE CIDER VINEGAR PO [place] apple cider vinegar     azelastine (ASTELIN) 0.1 % nasal spray Place 2 sprays into both nostrils 2 (two) times daily. Use in each nostril as directed 30 mL 3   cetirizine (ZYRTEC) 10 MG tablet Take 1 tablet (10 mg total) by mouth daily. 30 tablet 3   Cholecalciferol (VITAMIN D3) 50 MCG (2000 UT) CHEW Chew 2 capsules by mouth daily.     esomeprazole (NEXIUM) 40 MG capsule Take 40 mg by mouth daily at 12 noon.     fluticasone (FLONASE) 50 MCG/ACT nasal spray Place 2 sprays into both nostrils daily. 16 g 3   ibuprofen (ADVIL) 800 MG tablet Take 1 tablet (800 mg total) by mouth every 8 (eight) hours as needed for moderate pain. 30 tablet 11   influenza vac split quadrivalent PF (FLUARIX) 0.5 ML injection Inject 0.5 mLs into the muscle. 0.5 mL 0   Insulin Pen Needle (BD PEN NEEDLE NANO U/F) 32G X 4 MM MISC Use once daily. 100 each 0   Lactobacillus Rhamnosus, GG, (CULTURELLE PO) Take 1 tablet by mouth daily.     levothyroxine (SYNTHROID) 25 MCG tablet Take 1 tablet (25 mcg total) by mouth daily. 90 tablet 1   Multiple Vitamins-Iron (MULTIVITAMINS WITH IRON)  TABS tablet Take 1 tablet by mouth daily.  0   Olopatadine HCl 0.2 % SOLN Apply 1 drop to eye daily as needed (itchy watery eyes). 2.5 mL 3   Olopatadine HCl 0.6 % SOLN Place 2 sprays into the nose 2 (two) times daily. 30.5 g 3   Olopatadine-Mometasone 665-25 MCG/ACT SUSP Place 2 sprays into the nose 2 (two) times daily as needed. 29 g 5   OVER THE COUNTER MEDICATION 2 beets gummies daily     OVER THE COUNTER MEDICATION Tumeric     OVER THE COUNTER MEDICATION Rotates allegra,Claritin, and zyrtec     sertraline (ZOLOFT) 50 MG tablet Take 50 mg by mouth daily.     Vitamin D, Ergocalciferol, (DRISDOL) 1.25 MG (50000 UNIT) CAPS capsule Take 1 capsule (50,000 Units total) by mouth every 7 (seven) days. 4 capsule 0   Zoster Vaccine Adjuvanted  [facility]) injection Inject 0.5 mLs into the muscle once for 1 dose 0.5 mL 1   No current facility-administered medications on file prior to visit.   No Known Allergies     Objective:    BP (!) 142/92   Pulse 68   Temp 97.8 F (36.6 C) (Oral)   Ht 5\' 2"  (1.575 m)   Wt 242 lb (109.8 kg)   LMP 06/21/2022 (Approximate)   SpO2 100%   BMI 44.26 kg/m  BP Readings from Last 3 Encounters:  12/14/22 (!) 142/92  07/17/22 130/72  07/10/22 (!) 140/70      Physical Exam Vitals and nursing note reviewed.  Constitutional:      Appearance: Normal appearance. She is normal weight.  HENT:     Head: Normocephalic and atraumatic.  Cardiovascular:     Rate and Rhythm: Normal rate and regular rhythm.     Pulses: Normal pulses.     Heart sounds: Normal heart sounds.  Pulmonary:     Effort: Pulmonary effort is normal.     Breath sounds: Normal breath sounds.  Skin:    General: Skin is warm and dry.  Neurological:     General: No focal deficit present.     Mental Status: She is alert and oriented to person, place, and time. Mental status is at baseline.  Psychiatric:        Mood and Affect: Mood normal.        Behavior: Behavior normal.        Thought Content: Thought content normal.        Judgment: Judgment normal.     No results found for any visits on 12/14/22.      Assessment & Plan:   Problem List Items Addressed This Visit       Other   Elevated blood pressure reading without diagnosis of hypertension - Primary    BP 142/92 today, she does endorse increase stress and worry. Home readings reported as 163/88, 121/71, and 155/88 this AM. With her increase in anxiety I would like to increase her Zoloft to 100mg  daily and have her check her BP consistently in AM at home and return to office in 1 week. Instructed to seek medical care for BP>180/120, chest pain, palpitations, shortness of breath, swelling of extremities, vision changes, recurrent or severe headache.      GAD  (generalized anxiety disorder)    She endorses increase in stress and anxiety at home with 1 episode of panic attack. Will increase Zoloft to 100mg  daily and follow up on BP in 1 week.  No orders of the defined types were placed in this encounter.   Return in about 1 week (around 12/21/2022) for BP .  Rubie Maid, FNP

## 2022-12-21 ENCOUNTER — Encounter: Payer: Self-pay | Admitting: Family Medicine

## 2022-12-21 ENCOUNTER — Ambulatory Visit: Payer: 59 | Admitting: Family Medicine

## 2022-12-21 ENCOUNTER — Other Ambulatory Visit (HOSPITAL_COMMUNITY): Payer: Self-pay

## 2022-12-21 VITALS — BP 150/100 | HR 78 | Temp 98.4°F | Ht 62.0 in | Wt 243.0 lb

## 2022-12-21 DIAGNOSIS — I10 Essential (primary) hypertension: Secondary | ICD-10-CM

## 2022-12-21 MED ORDER — SERTRALINE HCL 100 MG PO TABS
100.0000 mg | ORAL_TABLET | Freq: Every day | ORAL | 0 refills | Status: DC
  Filled 2022-12-21: qty 90, 90d supply, fill #0

## 2022-12-21 MED ORDER — HYDROCHLOROTHIAZIDE 12.5 MG PO TABS
12.5000 mg | ORAL_TABLET | Freq: Every day | ORAL | 3 refills | Status: DC
  Filled 2022-12-21: qty 90, 90d supply, fill #0

## 2022-12-21 NOTE — Assessment & Plan Note (Signed)
BP remains elevated today. She has increased her Zoloft to 100mg  daily but is still under a lot of stress. Will start HCTZ 12.5mg  daily. Seek medical care for chest pain, palpitations, shortness of breath, lightheadedness, dizziness, swelling of extremities, vision changes, or recurrent headaches. Counseled on importance of heart healthy diet and 150  minutes moderate intensity exercise weekly. Follow up in 3 weeks for labs and BP check.

## 2022-12-21 NOTE — Progress Notes (Addendum)
Acute Office Visit  Subjective:     Patient ID: [name], female    DOB: 11-26-1970, 52 y.o.   MRN: [id number]  Chief Complaint  Patient presents with   Follow-up    1 week (around 12/21/2022) for BP - JBG\\\     HPI Patient is in today for blood pressure follow up. I saw her last week after she had a panic attack and some elevated BP readings at home. Her BP was 142/92 in office that day. BP at home has been elevated 150/80s at home since then. She increased her Zoloft  to 100 mg daily and reports stable anxiety. Denies chest pain, palpitations, shortness of breath, recurrent headaches, vision changes. Endorses swelling in bilateral ankles.  Review of Systems  All other systems reviewed and are negative.  Past Medical History:  Diagnosis Date   Anemia    Chronic allergic rhinitis    COVID 01-05-2021 result in epic   fatigue x 3 days   Elevated blood-pressure reading without diagnosis of hypertension    per pt being monitored by pcp, pt feels gets elevated in doctor office and with anxiety    (11-10-2019 cardiac CT showed normal coronaries and cal score zero;  echo 10-20-2019  ef 55%,G1DD, mild LVH, mild MR)   GAD (generalized anxiety disorder)    GERD (gastroesophageal reflux disease)    Hypothyroidism    Iron deficiency anemia secondary to blood loss (chronic) hematology/ onocology--- dr Ellin Sabakatragadda   secondary to uterine fibroids,  treated with oral iron and iron infusions   Menorrhagia    Panic attack    Tinnitus of left ear    chronic   Uterine fibroid    Vitamin D deficiency    Wears glasses    Past Surgical History:  Procedure Laterality Date   BREAST BIOPSY     CERVICAL CERCLAGE     CESAREAN SECTION     HYSTEROSCOPY WITH RESECTOSCOPE N/A 02/11/2021   Procedure: HYSTEROSCOPY WITH MYOSURE;  Surgeon: Maxie Betterousins, Sheronette, MD;  Location: [facility];  Service: Gynecology;  Laterality: N/A;   Current Outpatient Medications on File Prior to Visit   Medication Sig Dispense Refill   APPLE CIDER VINEGAR PO [place] apple cider vinegar     azelastine (ASTELIN) 0.1 % nasal spray Place 2 sprays into both nostrils 2 (two) times daily. Use in each nostril as directed 30 mL 3   cetirizine (ZYRTEC) 10 MG tablet Take 1 tablet (10 mg total) by mouth daily. 30 tablet 3   Cholecalciferol (VITAMIN D3) 50 MCG (2000 UT) CHEW Chew 2 capsules by mouth daily.     esomeprazole (NEXIUM) 40 MG capsule Take 40 mg by mouth daily at 12 noon.     fluticasone (FLONASE) 50 MCG/ACT nasal spray Place 2 sprays into both nostrils daily. 16 g 3   influenza vac split quadrivalent PF (FLUARIX) 0.5 ML injection Inject 0.5 mLs into the muscle. 0.5 mL 0   Insulin Pen Needle (BD PEN NEEDLE NANO U/F) 32G X 4 MM MISC Use once daily. 100 each 0   levothyroxine (SYNTHROID) 25 MCG tablet Take 1 tablet (25 mcg total) by mouth daily. 90 tablet 1   Multiple Vitamins-Iron (MULTIVITAMINS WITH IRON) TABS tablet Take 1 tablet by mouth daily.  0   Olopatadine HCl 0.2 % SOLN Apply 1 drop to eye daily as needed (itchy watery eyes). 2.5 mL 3   Olopatadine-Mometasone 665-25 MCG/ACT SUSP Place 2 sprays into the nose 2 (two) times  daily as needed. 29 g 5   OVER THE COUNTER MEDICATION 2 beets gummies daily     OVER THE COUNTER MEDICATION Tumeric     OVER THE COUNTER MEDICATION Rotates allegra,Claritin, and zyrtec     Vitamin D, Ergocalciferol, (DRISDOL) 1.25 MG (50000 UNIT) CAPS capsule Take 1 capsule (50,000 Units total) by mouth every 7 (seven) days. 4 capsule 0   Lactobacillus Rhamnosus, GG, (CULTURELLE PO) Take 1 tablet by mouth daily. (Patient not taking: Reported on 12/21/2022)     Olopatadine HCl 0.6 % SOLN Place 2 sprays into the nose 2 (two) times daily. (Patient not taking: Reported on 12/21/2022) 30.5 g 3   Zoster Vaccine Adjuvanted [facility]) injection Inject 0.5 mLs into the muscle once for 1 dose (Patient not taking: Reported on 12/21/2022) 0.5 mL 1   No current facility-administered  medications on file prior to visit.   Allergies  Allergen Reactions   Pineapple Itching        Objective:    BP (!) 150/100   Pulse 78   Temp 98.4 F (36.9 C) (Oral)   Ht 5\' 2"  (1.575 m)   Wt 243 lb (110.2 kg)   LMP 06/21/2022 (Approximate)   SpO2 100%   BMI 44.45 kg/m  BP Readings from Last 3 Encounters:  12/21/22 (!) 150/100  12/14/22 (!) 142/92  07/17/22 130/72      Physical Exam Vitals and nursing note reviewed.  Constitutional:      Appearance: Normal appearance. She is normal weight.  HENT:     Head: Normocephalic and atraumatic.  Cardiovascular:     Rate and Rhythm: Normal rate and regular rhythm.     Pulses: Normal pulses.     Heart sounds: Normal heart sounds.  Pulmonary:     Effort: Pulmonary effort is normal.     Breath sounds: Normal breath sounds.  Musculoskeletal:     Right lower leg: Edema present.     Left lower leg: Edema present.  Skin:    General: Skin is warm and dry.  Neurological:     General: No focal deficit present.     Mental Status: She is alert and oriented to person, place, and time. Mental status is at baseline.  Psychiatric:        Mood and Affect: Mood normal.        Behavior: Behavior normal.        Thought Content: Thought content normal.        Judgment: Judgment normal.     No results found for any visits on 12/21/22.      Assessment & Plan:   Problem List Items Addressed This Visit       Cardiovascular and Mediastinum   Primary hypertension - Primary    BP remains elevated today. She has increased her Zoloft to 100mg  daily but is still under a lot of stress. Will start HCTZ 12.5mg  daily. Seek medical care for chest pain, palpitations, shortness of breath, lightheadedness, dizziness, swelling of extremities, vision changes, or recurrent headaches. Counseled on importance of heart healthy diet and 150  minutes moderate intensity exercise weekly. Follow up in 3 weeks for labs and BP check.      Relevant  Medications   hydrochlorothiazide (HYDRODIURIL) 12.5 MG tablet    Meds ordered this encounter  Medications   sertraline (ZOLOFT) 100 MG tablet    Sig: Take 1 tablet (100 mg total) by mouth daily.    Dispense:  90 tablet    Refill:  0    Order Specific Question:   Supervising Provider    Answer:   Lynnea Ferrier T [[id number]]   hydrochlorothiazide (HYDRODIURIL) 12.5 MG tablet    Sig: Take 1 tablet (12.5 mg total) by mouth daily.    Dispense:  90 tablet    Refill:  3    Order Specific Question:   Supervising Provider    Answer:   Lynnea Ferrier T [[id number]]    Return in about 3 weeks (around 01/11/2023) for BP recheck and labs.  Park Meo, FNP

## 2023-01-01 DIAGNOSIS — H5203 Hypermetropia, bilateral: Secondary | ICD-10-CM | POA: Diagnosis not present

## 2023-01-01 DIAGNOSIS — H16223 Keratoconjunctivitis sicca, not specified as Sjogren's, bilateral: Secondary | ICD-10-CM | POA: Diagnosis not present

## 2023-01-01 DIAGNOSIS — D23121 Other benign neoplasm of skin of left upper eyelid, including canthus: Secondary | ICD-10-CM | POA: Diagnosis not present

## 2023-01-15 ENCOUNTER — Other Ambulatory Visit (HOSPITAL_COMMUNITY): Payer: Self-pay

## 2023-01-15 MED ORDER — AMOXICILLIN 500 MG PO CAPS
500.0000 mg | ORAL_CAPSULE | Freq: Three times a day (TID) | ORAL | 0 refills | Status: DC
  Filled 2023-01-15: qty 30, 9d supply, fill #0

## 2023-02-01 ENCOUNTER — Ambulatory Visit: Payer: 59 | Admitting: Family Medicine

## 2023-02-01 ENCOUNTER — Encounter: Payer: Self-pay | Admitting: Family Medicine

## 2023-02-01 VITALS — BP 132/98 | HR 78 | Temp 97.8°F | Ht 62.0 in | Wt 240.0 lb

## 2023-02-01 DIAGNOSIS — I10 Essential (primary) hypertension: Secondary | ICD-10-CM

## 2023-02-01 NOTE — Assessment & Plan Note (Signed)
Well controlled home readings, she does not have documented readings but will monitor over the weekend and report to office Monday. Continue HCTZ 12.5mg  daily. Seek medical care for chest pain, palpitations, shortness of breath, lightheadedness, dizziness, swelling of extremities, vision changes, or recurrent headaches. Counseled on importance of heart healthy diet and 150  minutes moderate intensity exercise weekly. Follow up in 3 months.

## 2023-02-01 NOTE — Progress Notes (Signed)
   Acute Office Visit  Subjective:     Patient ID: [name], female    DOB: [birth year], 52 y.o.   MRN: [id number]  Chief Complaint  Patient presents with   Follow-up    3 week f/u bp and labs    HPI Patient is in today for blood pressure follow-up. She was started on HCTZ 12.5mg  last month. She has also been taking her Zoloft 100mg  daily as prescribed.  HYPERTENSION without Chronic Kidney Disease Hypertension status: better  Satisfied with current treatment? yes Duration of hypertension: months BP monitoring frequency:   every other week BP range: <140/90 DBP is in 80s at home BP medication side effects:  no Medication compliance: excellent compliance Previous BP meds:HCTZ Aspirin: no Recurrent headaches: no Visual changes: no Palpitations: no Dyspnea: no Chest pain: no Lower extremity edema: no Dizzy/lightheaded: no   Review of Systems  All other systems reviewed and are negative.       Objective:    BP (!) 132/98   Pulse 78   Temp 97.8 F (36.6 C) (Oral)   Ht 5\' 2"  (1.575 m)   Wt 240 lb (108.9 kg)   LMP 12/27/2022 Comment: pt was spotting  SpO2 99%   BMI 43.90 kg/m  BP Readings from Last 3 Encounters:  02/01/23 (!) 132/98  12/21/22 (!) 150/100  12/14/22 (!) 142/92      Physical Exam Vitals and nursing note reviewed.  Constitutional:      Appearance: Normal appearance. She is normal weight.  HENT:     Head: Normocephalic and atraumatic.  Cardiovascular:     Rate and Rhythm: Normal rate and regular rhythm.     Pulses: Normal pulses.     Heart sounds: Normal heart sounds.  Pulmonary:     Effort: Pulmonary effort is normal.     Breath sounds: Normal breath sounds.  Skin:    General: Skin is warm and dry.  Neurological:     General: No focal deficit present.     Mental Status: She is alert and oriented to person, place, and time. Mental status is at baseline.  Psychiatric:        Mood and Affect: Mood normal.        Behavior:  Behavior normal.        Thought Content: Thought content normal.        Judgment: Judgment normal.     No results found for any visits on 02/01/23.      Assessment & Plan:   Problem List Items Addressed This Visit     Primary hypertension - Primary    Well controlled home readings, she does not have documented readings but will monitor over the weekend and report to office Monday. Continue HCTZ 12.5mg  daily. Seek medical care for chest pain, palpitations, shortness of breath, lightheadedness, dizziness, swelling of extremities, vision changes, or recurrent headaches. Counseled on importance of heart healthy diet and 150  minutes moderate intensity exercise weekly. Follow up in 3 months.      Relevant Orders   COMPLETE METABOLIC PANEL WITH GFR    No orders of the defined types were placed in this encounter.   Return in about 5 months (around 07/04/2023) for annual physical, labs 1 week prior.  Park Meo, FNP

## 2023-02-02 LAB — COMPLETE METABOLIC PANEL WITH GFR
AG Ratio: 1.4 (calc) (ref 1.0–2.5)
ALT: 20 U/L (ref 6–29)
AST: 14 U/L (ref 10–35)
Albumin: 4.2 g/dL (ref 3.6–5.1)
Alkaline phosphatase (APISO): 62 U/L (ref 37–153)
BUN: 20 mg/dL (ref 7–25)
CO2: 25 mmol/L (ref 20–32)
Calcium: 9.8 mg/dL (ref 8.6–10.4)
Chloride: 101 mmol/L (ref 98–110)
Creat: 0.82 mg/dL (ref 0.50–1.03)
Globulin: 3 g/dL (calc) (ref 1.9–3.7)
Glucose, Bld: 86 mg/dL (ref 65–99)
Potassium: 3.7 mmol/L (ref 3.5–5.3)
Sodium: 138 mmol/L (ref 135–146)
Total Bilirubin: 0.4 mg/dL (ref 0.2–1.2)
Total Protein: 7.2 g/dL (ref 6.1–8.1)
eGFR: 87 mL/min/{1.73_m2} (ref >=60)

## 2023-02-09 ENCOUNTER — Other Ambulatory Visit (HOSPITAL_COMMUNITY): Payer: Self-pay

## 2023-02-12 ENCOUNTER — Other Ambulatory Visit (HOSPITAL_COMMUNITY): Payer: Self-pay

## 2023-03-07 ENCOUNTER — Other Ambulatory Visit (HOSPITAL_COMMUNITY): Payer: Self-pay

## 2023-03-16 ENCOUNTER — Other Ambulatory Visit (HOSPITAL_COMMUNITY): Payer: Self-pay

## 2023-04-10 ENCOUNTER — Ambulatory Visit: Payer: Managed Care, Other (non HMO) | Admitting: Family Medicine

## 2023-04-10 ENCOUNTER — Encounter: Payer: Self-pay | Admitting: Family Medicine

## 2023-04-10 ENCOUNTER — Encounter (HOSPITAL_COMMUNITY): Payer: Self-pay | Admitting: Hematology and Oncology

## 2023-04-10 VITALS — BP 120/80 | HR 77 | Temp 98.2°F | Ht 62.0 in | Wt 241.0 lb

## 2023-04-10 DIAGNOSIS — N3000 Acute cystitis without hematuria: Secondary | ICD-10-CM | POA: Diagnosis not present

## 2023-04-10 DIAGNOSIS — Z1211 Encounter for screening for malignant neoplasm of colon: Secondary | ICD-10-CM | POA: Diagnosis not present

## 2023-04-10 LAB — URINALYSIS, ROUTINE W REFLEX MICROSCOPIC
Bacteria, UA: NONE SEEN /HPF
Bilirubin Urine: NEGATIVE
Glucose, UA: NEGATIVE
Hyaline Cast: NONE SEEN /LPF
Ketones, ur: NEGATIVE
Leukocytes,Ua: NEGATIVE
Nitrite: NEGATIVE
Protein, ur: NEGATIVE
Specific Gravity, Urine: 1.01 (ref 1.001–1.035)
WBC, UA: NONE SEEN /HPF (ref 0–5)
pH: 7 (ref 5.0–8.0)

## 2023-04-10 LAB — MICROSCOPIC MESSAGE

## 2023-04-10 MED ORDER — NITROFURANTOIN MONOHYD MACRO 100 MG PO CAPS: 100.0000 mg | ORAL_CAPSULE | Freq: Two times a day (BID) | ORAL | 0 refills | Status: AC

## 2023-04-10 NOTE — Assessment & Plan Note (Signed)
Urine dipstick shows positive for RBC's.  Micro exam: 0.5 RBC's per HPF. Will treat for clinical symptoms with Macrobid BID for 5 days. Return to office if symptoms persist or worsen.

## 2023-04-10 NOTE — Progress Notes (Signed)
Subjective:  HPI: [name] is a 52 y.o. female presenting on 04/10/2023 for Acute Visit (UTI)   HPI Patient is in today for 6 days of urinary urgency, frequency, itching, bladder fullness. She did change how she was taking her hydrochlorothiazide from day to night dosing. She has a history of UTI with similar symptoms.  Denies dysuria, vaginal discharge or bleeding, abdominal pain, flank pain, fever, chills, body aches Has tried drinking cranberry juice.  Review of Systems  All other systems reviewed and are negative.   Relevant past medical history reviewed and updated as indicated.   Past Medical History:  Diagnosis Date   Anemia    Chronic allergic rhinitis    COVID 01-05-2021 result in epic   fatigue x 3 days   Elevated blood-pressure reading without diagnosis of hypertension    per pt being monitored by pcp, pt feels gets elevated in doctor office and with anxiety    (11-10-2019 cardiac CT showed normal coronaries and cal score zero;  echo 10-20-2019  ef 55%,G1DD, mild LVH, mild MR)   GAD (generalized anxiety disorder)    GERD (gastroesophageal reflux disease)    Hypothyroidism    Iron deficiency anemia secondary to blood loss (chronic) hematology/ onocology--- dr Ellin Saba   secondary to uterine fibroids,  treated with oral iron and iron infusions   Menorrhagia    Panic attack    Tinnitus of left ear    chronic   Uterine fibroid    Vitamin D deficiency    Wears glasses      Past Surgical History:  Procedure Laterality Date   BREAST BIOPSY     CERVICAL CERCLAGE     CESAREAN SECTION     HYSTEROSCOPY WITH RESECTOSCOPE N/A 02/11/2021   Procedure: HYSTEROSCOPY WITH MYOSURE;  Surgeon: Maxie Better, MD;  Location: [facility];  Service: Gynecology;  Laterality: N/A;    Allergies and medications reviewed and updated.   Current Outpatient Medications:    APPLE CIDER VINEGAR PO, [place]oli apple cider vinegar, Disp: , Rfl:    azelastine  (ASTELIN) 0.1 % nasal spray, Place 2 sprays into both nostrils 2 (two) times daily. Use in each nostril as directed, Disp: 30 mL, Rfl: 3   cetirizine (ZYRTEC) 10 MG tablet, Take 1 tablet (10 mg total) by mouth daily., Disp: 30 tablet, Rfl: 3   Cholecalciferol (VITAMIN D3) 50 MCG (2000 UT) CHEW, Chew 2 capsules by mouth daily., Disp: , Rfl:    esomeprazole (NEXIUM) 40 MG capsule, Take 40 mg by mouth daily at 12 noon., Disp: , Rfl:    fluticasone (FLONASE) 50 MCG/ACT nasal spray, Place 2 sprays into both nostrils daily., Disp: 16 g, Rfl: 3   hydrochlorothiazide (HYDRODIURIL) 12.5 MG tablet, Take 1 tablet (12.5 mg total) by mouth daily., Disp: 90 tablet, Rfl: 3   influenza vac split quadrivalent PF (FLUARIX) 0.5 ML injection, Inject 0.5 mLs into the muscle., Disp: 0.5 mL, Rfl: 0   Insulin Pen Needle (BD PEN NEEDLE NANO U/F) 32G X 4 MM MISC, Use once daily., Disp: 100 each, Rfl: 0   Lactobacillus Rhamnosus, GG, (CULTURELLE PO), Take 1 tablet by mouth daily., Disp: , Rfl:    levothyroxine (SYNTHROID) 25 MCG tablet, Take 1 tablet (25 mcg total) by mouth daily., Disp: 90 tablet, Rfl: 1   Multiple Vitamins-Iron (MULTIVITAMINS WITH IRON) TABS tablet, Take 1 tablet by mouth daily., Disp: , Rfl: 0   Olopatadine HCl 0.2 % SOLN, Apply 1 drop to eye daily  as needed (itchy watery eyes)., Disp: 2.5 mL, Rfl: 3   Olopatadine HCl 0.6 % SOLN, Place 2 sprays into the nose 2 (two) times daily., Disp: 30.5 g, Rfl: 3   Olopatadine-Mometasone 665-25 MCG/ACT SUSP, Place 2 sprays into the nose 2 (two) times daily as needed., Disp: 29 g, Rfl: 5   OVER THE COUNTER MEDICATION, 2 beets gummies daily, Disp: , Rfl:    OVER THE COUNTER MEDICATION, Tumeric, Disp: , Rfl:    OVER THE COUNTER MEDICATION, Rotates allegra,Claritin, and zyrtec, Disp: , Rfl:    sertraline (ZOLOFT) 100 MG tablet, Take 1 tablet (100 mg total) by mouth daily., Disp: 90 tablet, Rfl: 0   Vitamin D, Ergocalciferol, (DRISDOL) 1.25 MG (50000 UNIT) CAPS capsule,  Take 1 capsule (50,000 Units total) by mouth every 7 (seven) days., Disp: 4 capsule, Rfl: 0   Zoster Vaccine Adjuvanted (SHINGRIX) injection, Inject 0.5 mLs into the muscle once for 1 dose, Disp: 0.5 mL, Rfl: 1   amoxicillin (AMOXIL) 500 MG capsule, Take 2 capsules (1,000 mg total) now, then Take 1 capsule (500 mg total) by mouth 3 (three) times daily until gone (Patient not taking: Reported on 04/10/2023), Disp: 30 capsule, Rfl: 0  Allergies  Allergen Reactions   Pineapple Itching    Objective:   BP 120/80   Pulse 77   Temp 98.2 F (36.8 C) (Oral)   Ht 5\' 2"  (1.575 m)   Wt 241 lb (109.3 kg)   SpO2 100%   BMI 44.08 kg/m      04/10/2023   12:15 PM 02/01/2023   12:07 PM 02/01/2023   12:02 PM  Vitals with BMI  Height 5\' 2"   5\' 2"   Weight 241 lbs  240 lbs  BMI 44.07  43.89  Systolic 120 132 235  Diastolic 80 98 100  Pulse 77  78     Physical Exam Vitals and nursing note reviewed.  Constitutional:      Appearance: Normal appearance. She is normal weight.  HENT:     Head: Normocephalic and atraumatic.  Cardiovascular:     Rate and Rhythm: Normal rate and regular rhythm.     Pulses: Normal pulses.     Heart sounds: Normal heart sounds.  Pulmonary:     Effort: Pulmonary effort is normal.     Breath sounds: Normal breath sounds.  Abdominal:     General: There is no distension.     Tenderness: There is no abdominal tenderness. There is no right CVA tenderness or left CVA tenderness.  Skin:    General: Skin is warm and dry.  Neurological:     General: No focal deficit present.     Mental Status: She is alert and oriented to person, place, and time. Mental status is at baseline.  Psychiatric:        Mood and Affect: Mood normal.        Behavior: Behavior normal.        Thought Content: Thought content normal.        Judgment: Judgment normal.     Assessment & Plan:  Acute cystitis without hematuria Assessment & Plan: Urine dipstick shows positive for RBC's.  Micro  exam: 0.5 RBC's per HPF. Will treat for clinical symptoms with Macrobid BID for 5 days. Return to office if symptoms persist or worsen.    Screen for colon cancer -     Cologuard     Follow up plan: Return if symptoms worsen or fail to improve.  Hospital doctor  Jeannette How, FNP

## 2023-04-10 NOTE — Addendum Note (Signed)
Addended by: Arta Silence on: 04/10/2023 12:49 PM   Modules accepted: Orders

## 2023-07-04 ENCOUNTER — Ambulatory Visit: Payer: 59 | Admitting: Family Medicine

## 2023-07-10 ENCOUNTER — Ambulatory Visit (INDEPENDENT_AMBULATORY_CARE_PROVIDER_SITE_OTHER): Payer: Managed Care, Other (non HMO) | Admitting: Family Medicine

## 2023-07-10 ENCOUNTER — Encounter: Payer: Self-pay | Admitting: Family Medicine

## 2023-07-10 VITALS — BP 128/90 | HR 84 | Temp 97.8°F | Ht 62.0 in | Wt 242.0 lb

## 2023-07-10 DIAGNOSIS — Z0000 Encounter for general adult medical examination without abnormal findings: Secondary | ICD-10-CM | POA: Insufficient documentation

## 2023-07-10 DIAGNOSIS — Z0001 Encounter for general adult medical examination with abnormal findings: Secondary | ICD-10-CM

## 2023-07-10 DIAGNOSIS — I10 Essential (primary) hypertension: Secondary | ICD-10-CM | POA: Diagnosis not present

## 2023-07-10 DIAGNOSIS — Z1231 Encounter for screening mammogram for malignant neoplasm of breast: Secondary | ICD-10-CM

## 2023-07-10 DIAGNOSIS — Z23 Encounter for immunization: Secondary | ICD-10-CM

## 2023-07-10 DIAGNOSIS — E038 Other specified hypothyroidism: Secondary | ICD-10-CM

## 2023-07-10 DIAGNOSIS — K219 Gastro-esophageal reflux disease without esophagitis: Secondary | ICD-10-CM

## 2023-07-10 DIAGNOSIS — J309 Allergic rhinitis, unspecified: Secondary | ICD-10-CM

## 2023-07-10 DIAGNOSIS — K645 Perianal venous thrombosis: Secondary | ICD-10-CM | POA: Insufficient documentation

## 2023-07-10 NOTE — Assessment & Plan Note (Signed)

## 2023-07-10 NOTE — Assessment & Plan Note (Signed)
Chronic. TSH ordered and will titrate Levothyroxine as needed. Follow up in 6 months or sooner if needed.

## 2023-07-10 NOTE — Assessment & Plan Note (Addendum)
Chronic well controlled. Continue hydrochlorothiazide 12.5mg  daily. Encouraged to monitor at home and return to office if readings sustain >140/90. Recommend heart healthy diet such as Mediterranean diet with whole grains, fruits, vegetable, fish, lean meats, nuts, and olive oil. Limit salt. Encouraged moderate walking, 3-5 times/week for 30-50 minutes each session. Aim for at least 150 minutes.week. Goal should be pace of 3 miles/hours, or walking 1.5 miles in 30 minutes. Avoid tobacco products. Avoid excess alcohol. Take medications as prescribed and bring medications and blood pressure log with cuff to each office visit. Seek medical care for chest pain, palpitations, shortness of breath with exertion, dizziness/lightheadedness, vision changes, recurrent headaches, or swelling of extremities. CMP ordered. Follow up in 6 months.

## 2023-07-10 NOTE — Addendum Note (Signed)
Addended by: Arta Silence on: 07/10/2023 02:08 PM   Modules accepted: Orders

## 2023-07-10 NOTE — Assessment & Plan Note (Signed)
Chronic uncontrolled. Recommended trialing switch from Claritin to Zyrtec. Continue Flonase. Return to clinic in 6 months or sooner if needed.

## 2023-07-10 NOTE — Progress Notes (Signed)
Complete physical exam  Patient: [name]   DOB: March 27, 1971   52 y.o. Female  MRN: [id number]  Subjective:    Chief Complaint  Patient presents with   Follow-up    5 mos f/u    [name] is a 52 y.o. female who presents today for a complete physical exam. She reports consuming a general diet. Home exercise routine includes walking 2-3 hrs per week. She generally feels well. She reports sleeping well. She does not have additional problems to discuss today.  HYPERTENSION without Chronic Kidney Disease Hypertension status: controlled  Satisfied with current treatment? yes Duration of hypertension: chronic BP monitoring frequency:  not checking BP range:  BP medication side effects:  no Medication compliance: excellent compliance Previous BP meds:HCTZ Aspirin: no Recurrent headaches: no Visual changes: no Palpitations: no Dyspnea: no Chest pain: no Lower extremity edema: no Dizzy/lightheaded: no  HYPOTHYROIDISM Thyroid control status:controlled Satisfied with current treatment? yes Medication side effects: no Medication compliance: excellent compliance Etiology of hypothyroidism:  Recent dose adjustment:no Fatigue: yes Cold intolerance: no Heat intolerance: yes Weight gain: no Weight loss: no Constipation: no Diarrhea/loose stools: no Palpitations: no Lower extremity edema: no Anxiety/depressed mood: no   Most recent fall risk assessment:    12/14/2022    8:11 AM  Fall Risk   Falls in the past year? 0  Number falls in past yr: 0  Injury with Fall? 0     Most recent depression screenings:    12/14/2022    8:10 AM 07/17/2022   12:21 PM  PHQ 2/9 Scores  PHQ - 2 Score 0 0  PHQ- 9 Score 5 0    Vision:Within last year and Dental: No current dental problems and Receives regular dental care  Patient Active Problem List   Diagnosis Date Noted   Physical exam, annual 07/10/2023   Acute cystitis without hematuria 04/10/2023   Primary  hypertension 12/21/2022   Vitamin D deficiency 04/26/2022   Insulin resistance 04/26/2022   Fatty liver 08/24/2020   Class 3 severe obesity with serious comorbidity and body mass index (BMI) of 40.0 to 44.9 in adult (HCC) 08/24/2020   GAD (generalized anxiety disorder) 11/18/2019   Situational depression 11/18/2019   Family history of early CAD 10/08/2019   Elevated blood pressure reading without diagnosis of hypertension 10/08/2019   Iron deficiency anemia 09/30/2019   Hypothyroidism 12/04/2016   Gastroesophageal reflux disease 12/04/2016   Chronic allergic rhinitis 12/04/2016   Past Medical History:  Diagnosis Date   Anemia    Chronic allergic rhinitis    COVID 01-05-2021 result in epic   fatigue x 3 days   Elevated blood-pressure reading without diagnosis of hypertension    per pt being monitored by pcp, pt feels gets elevated in doctor office and with anxiety    (11-10-2019 cardiac CT showed normal coronaries and cal score zero;  echo 10-20-2019  ef 55%,G1DD, mild LVH, mild MR)   GAD (generalized anxiety disorder)    GERD (gastroesophageal reflux disease)    Hypothyroidism    Iron deficiency anemia secondary to blood loss (chronic) hematology/ onocology--- dr Ellin Saba   secondary to uterine fibroids,  treated with oral iron and iron infusions   Menorrhagia    Panic attack    Tinnitus of left ear    chronic   Uterine fibroid    Vitamin D deficiency    Wears glasses    Past Surgical History:  Procedure Laterality Date   BREAST BIOPSY  CERVICAL CERCLAGE     CESAREAN SECTION     HYSTEROSCOPY WITH RESECTOSCOPE N/A 02/11/2021   Procedure: HYSTEROSCOPY WITH MYOSURE;  Surgeon: Maxie Better, MD;  Location: [facility] [place];  Service: Gynecology;  Laterality: N/A;   Social History   Tobacco Use   Smoking status: Never    Passive exposure: Never   Smokeless tobacco: Never  Vaping Use   Vaping status: Never Used  Substance Use Topics   Alcohol use: Yes     Comment: occasional   Drug use: Never   Family History  Problem Relation Age of Onset   Allergic rhinitis Mother    Arthritis Mother    Hypertension Mother    Diabetes Mother    High Cholesterol Mother    Allergic rhinitis Father    Hyperlipidemia Father    Hypertension Father    Diabetes Father    Allergic rhinitis Brother    Arthritis Maternal Grandmother    Stroke Maternal Grandfather    Diabetes Paternal Grandmother    Heart disease Paternal Grandfather    Cancer Maternal Aunt        breast   Breast cancer Maternal Aunt 38   Cancer Paternal Aunt        breast   Breast cancer Paternal Aunt 67   Breast cancer Paternal Aunt 30   Asthma Niece    Allergies  Allergen Reactions   Pineapple Itching      Patient Care Team: Park Meo, FNP as PCP - General (Family Medicine)   Outpatient Medications Prior to Visit  Medication Sig   APPLE CIDER VINEGAR PO [place] apple cider vinegar   azelastine (ASTELIN) 0.1 % nasal spray Place 2 sprays into both nostrils 2 (two) times daily. Use in each nostril as directed   cetirizine (ZYRTEC) 10 MG tablet Take 1 tablet (10 mg total) by mouth daily.   Cholecalciferol (VITAMIN D3) 50 MCG (2000 UT) CHEW Chew 2 capsules by mouth daily.   esomeprazole (NEXIUM) 40 MG capsule Take 40 mg by mouth daily at 12 noon.   fluticasone (FLONASE) 50 MCG/ACT nasal spray Place 2 sprays into both nostrils daily.   hydrochlorothiazide (HYDRODIURIL) 12.5 MG tablet Take 1 tablet (12.5 mg total) by mouth daily.   influenza vac split quadrivalent PF (FLUARIX) 0.5 ML injection Inject 0.5 mLs into the muscle.   Insulin Pen Needle (BD PEN NEEDLE NANO U/F) 32G X 4 MM MISC Use once daily.   Lactobacillus Rhamnosus, GG, (CULTURELLE PO) Take 1 tablet by mouth daily.   levothyroxine (SYNTHROID) 25 MCG tablet Take 1 tablet (25 mcg total) by mouth daily.   Multiple Vitamins-Iron (MULTIVITAMINS WITH IRON) TABS tablet Take 1 tablet by mouth daily.   nitrofurantoin,  macrocrystal-monohydrate, (MACROBID) 100 MG capsule Take 1 capsule (100 mg total) by mouth 2 (two) times daily.   Olopatadine HCl 0.2 % SOLN Apply 1 drop to eye daily as needed (itchy watery eyes).   Olopatadine HCl 0.6 % SOLN Place 2 sprays into the nose 2 (two) times daily.   Olopatadine-Mometasone 829-56 MCG/ACT SUSP Place 2 sprays into the nose 2 (two) times daily as needed.   OVER THE COUNTER MEDICATION 2 beets gummies daily   OVER THE COUNTER MEDICATION Tumeric   OVER THE COUNTER MEDICATION Rotates allegra,Claritin, and zyrtec   sertraline (ZOLOFT) 100 MG tablet Take 1 tablet (100 mg total) by mouth daily.   Vitamin D, Ergocalciferol, (DRISDOL) 1.25 MG (50000 UNIT) CAPS capsule Take 1 capsule (50,000 Units total) by  mouth every 7 (seven) days.   Zoster Vaccine Adjuvanted [facility]) injection Inject 0.5 mLs into the muscle once for 1 dose   [DISCONTINUED] amoxicillin (AMOXIL) 500 MG capsule Take 2 capsules (1,000 mg total) now, then Take 1 capsule (500 mg total) by mouth 3 (three) times daily until gone (Patient not taking: Reported on 04/10/2023)   No facility-administered medications prior to visit.    Review of Systems  Constitutional: Negative.   HENT:  Positive for congestion.   Eyes: Negative.   Respiratory: Negative.    Cardiovascular: Negative.   Gastrointestinal: Negative.   Genitourinary: Negative.   Musculoskeletal: Negative.   Skin: Negative.   Neurological: Negative.   Endo/Heme/Allergies:  Positive for environmental allergies.  Psychiatric/Behavioral: Negative.    All other systems reviewed and are negative.         Objective:     BP (!) 128/90   Pulse 84   Temp 97.8 F (36.6 C) (Oral)   Ht 5\' 2"  (1.575 m)   Wt 242 lb (109.8 kg)   LMP 02/21/2023 (Approximate)   SpO2 98%   BMI 44.26 kg/m  BP Readings from Last 3 Encounters:  07/10/23 (!) 128/90  04/10/23 120/80  02/01/23 (!) 132/98   Wt Readings from Last 3 Encounters:  07/10/23 242 lb (109.8 kg)   04/10/23 241 lb (109.3 kg)  02/01/23 240 lb (108.9 kg)      Physical Exam Vitals and nursing note reviewed.  Constitutional:      Appearance: Normal appearance. She is obese.  HENT:     Head: Normocephalic and atraumatic.     Right Ear: Tympanic membrane, ear canal and external ear normal. There is impacted cerumen.     Left Ear: Tympanic membrane, ear canal and external ear normal. There is impacted cerumen.     Nose: Nose normal.     Mouth/Throat:     Mouth: Mucous membranes are moist.     Pharynx: Oropharynx is clear.  Eyes:     Extraocular Movements: Extraocular movements intact.     Conjunctiva/sclera: Conjunctivae normal.     Pupils: Pupils are equal, round, and reactive to light.  Cardiovascular:     Rate and Rhythm: Normal rate and regular rhythm.     Pulses: Normal pulses.     Heart sounds: Normal heart sounds.  Pulmonary:     Effort: Pulmonary effort is normal.     Breath sounds: Normal breath sounds.  Abdominal:     General: Bowel sounds are normal.     Palpations: Abdomen is soft.  Musculoskeletal:        General: Normal range of motion.     Cervical back: Normal range of motion and neck supple.  Skin:    General: Skin is warm and dry.     Capillary Refill: Capillary refill takes less than 2 seconds.  Neurological:     General: No focal deficit present.     Mental Status: She is alert and oriented to person, place, and time. Mental status is at baseline.  Psychiatric:        Mood and Affect: Mood normal.        Behavior: Behavior normal.        Thought Content: Thought content normal.        Judgment: Judgment normal.      No results found for any visits on 07/10/23.     Assessment & Plan:    Routine Health Maintenance and Physical Exam  Immunization History  Administered  Date(s) Administered   Influenza,inj,Quad PF,6+ Mos 06/30/2019, 07/07/2020, 07/04/2021, 07/06/2022   Influenza-Unspecified 05/12/2016   Tdap 12/04/2016    Health  Maintenance  Topic Date Due   INFLUENZA VACCINE  04/12/2023   COVID-19 Vaccine (1 - 2023-24 season) 07/26/2023 (Originally 05/13/2023)   Zoster Vaccines- Shingrix (1 of 2) 10/10/2023 (Originally 08/26/2021)   Cervical Cancer Screening (HPV/Pap Cotest)  05/30/2024   MAMMOGRAM  08/02/2024   Fecal DNA (Cologuard)  11/23/2024   DTaP/Tdap/Td (2 - Td or Tdap) 12/05/2026   Hepatitis C Screening  Completed   HIV Screening  Completed   HPV VACCINES  Aged Out    Discussed health benefits of physical activity, and encouraged her to engage in regular exercise appropriate for her age and condition.  Problem List Items Addressed This Visit     Hypothyroidism    Chronic. TSH ordered and will titrate Levothyroxine as needed. Follow up in 6 months or sooner if needed.      Relevant Orders   Thyroid Panel With TSH   Gastroesophageal reflux disease    Chronic well controlled on Nexium 40mg  as needed. Anti-reflux measures such as raising the head of the bed, avoiding tight clothing or belts, avoiding eating late at night and not lying down shortly after mealtime and achieving weight loss encouraged. Avoid ASA, NSAID's, caffeine, peppermints, alcohol and tobacco. OTC H2 blockers and/or antacids are often very helpful for PRN use. Follow up in 6 months or sooner if needed.      Chronic allergic rhinitis    Chronic uncontrolled. Recommended trialing switch from Claritin to Zyrtec. Continue Flonase. Return to clinic in 6 months or sooner if needed.      Primary hypertension    Chronic well controlled. Continue hydrochlorothiazide 12.5mg  daily. Encouraged to monitor at home and return to office if readings sustain >140/90. Recommend heart healthy diet such as Mediterranean diet with whole grains, fruits, vegetable, fish, lean meats, nuts, and olive oil. Limit salt. Encouraged moderate walking, 3-5 times/week for 30-50 minutes each session. Aim for at least 150 minutes.week. Goal should be pace of 3  miles/hours, or walking 1.5 miles in 30 minutes. Avoid tobacco products. Avoid excess alcohol. Take medications as prescribed and bring medications and blood pressure log with cuff to each office visit. Seek medical care for chest pain, palpitations, shortness of breath with exertion, dizziness/lightheadedness, vision changes, recurrent headaches, or swelling of extremities. CMP ordered. Follow up in 6 months.        Relevant Orders   CBC with Differential/Platelet   COMPLETE METABOLIC PANEL WITH GFR   Lipid panel   Physical exam, annual - Primary    Today your medical history was reviewed and routine physical exam with labs was performed. Recommend 150 minutes of moderate intensity exercise weekly and consuming a well-balanced diet. Advised to stop smoking if a smoker, avoid smoking if a non-smoker, limit alcohol consumption to 1 drink per day for women and 2 drinks per day for men, and avoid illicit drug use. Counseled on safe sex practices and offered STI testing today. Counseled on the importance of sunscreen use. Counseled in mental health awareness and when to seek medical care. Vaccine maintenance discussed. Appropriate health maintenance items reviewed. Return to office in 1 year for annual physical exam.       Relevant Orders   CBC with Differential/Platelet   COMPLETE METABOLIC PANEL WITH GFR   Lipid panel   Thyroid Panel With TSH   Other Visit Diagnoses  Encounter for screening mammogram for malignant neoplasm of breast       Relevant Orders   MM DIGITAL SCREENING BILATERAL      Return in about 6 months (around 01/08/2024) for chronic follow-up with labs 1 week prior (TSH, CMP).     Park Meo, FNP

## 2023-07-10 NOTE — Assessment & Plan Note (Signed)
Chronic well controlled on Nexium 40mg  as needed. Anti-reflux measures such as raising the head of the bed, avoiding tight clothing or belts, avoiding eating late at night and not lying down shortly after mealtime and achieving weight loss encouraged. Avoid ASA, NSAID's, caffeine, peppermints, alcohol and tobacco. OTC H2 blockers and/or antacids are often very helpful for PRN use. Follow up in 6 months or sooner if needed.

## 2023-07-11 ENCOUNTER — Other Ambulatory Visit: Payer: Managed Care, Other (non HMO)

## 2023-07-12 LAB — CBC WITH DIFFERENTIAL/PLATELET
Absolute Lymphocytes: 2464 {cells}/uL (ref 850–3900)
Absolute Monocytes: 614 {cells}/uL (ref 200–950)
Basophils Absolute: 51 {cells}/uL (ref 0–200)
Basophils Relative: 0.8 %
Eosinophils Absolute: 192 {cells}/uL (ref 15–500)
Eosinophils Relative: 3 %
HCT: 39.9 % (ref 35.0–45.0)
Hemoglobin: 13 g/dL (ref 11.7–15.5)
MCH: 29.5 pg (ref 27.0–33.0)
MCHC: 32.6 g/dL (ref 32.0–36.0)
MCV: 90.7 fL (ref 80.0–100.0)
MPV: 10.5 fL (ref 7.5–12.5)
Monocytes Relative: 9.6 %
Neutro Abs: 3078 {cells}/uL (ref 1500–7800)
Neutrophils Relative %: 48.1 %
Platelets: 293 10*3/uL (ref 140–400)
RBC: 4.4 10*6/uL (ref 3.80–5.10)
RDW: 12.5 % (ref 11.0–15.0)
Total Lymphocyte: 38.5 %
WBC: 6.4 10*3/uL (ref 3.8–10.8)

## 2023-07-12 LAB — COMPLETE METABOLIC PANEL WITH GFR
AG Ratio: 1.4 (calc) (ref 1.0–2.5)
ALT: 28 U/L (ref 6–29)
AST: 22 U/L (ref 10–35)
Albumin: 4 g/dL (ref 3.6–5.1)
Alkaline phosphatase (APISO): 64 U/L (ref 37–153)
BUN: 12 mg/dL (ref 7–25)
CO2: 27 mmol/L (ref 20–32)
Calcium: 9.3 mg/dL (ref 8.6–10.4)
Chloride: 105 mmol/L (ref 98–110)
Creat: 0.9 mg/dL (ref 0.50–1.03)
Globulin: 2.8 g/dL (ref 1.9–3.7)
Glucose, Bld: 80 mg/dL (ref 65–99)
Potassium: 4.2 mmol/L (ref 3.5–5.3)
Sodium: 141 mmol/L (ref 135–146)
Total Bilirubin: 0.3 mg/dL (ref 0.2–1.2)
Total Protein: 6.8 g/dL (ref 6.1–8.1)
eGFR: 77 mL/min/{1.73_m2} (ref >=60)

## 2023-07-12 LAB — LIPID PANEL
Cholesterol: 170 mg/dL (ref <200)
HDL: 73 mg/dL (ref >=50)
LDL Cholesterol (Calc): 82 mg/dL
Non-HDL Cholesterol (Calc): 97 mg/dL (ref <130)
Total CHOL/HDL Ratio: 2.3 (calc) (ref <5.0)
Triglycerides: 66 mg/dL (ref <150)

## 2023-07-12 LAB — THYROID PANEL WITH TSH
Free Thyroxine Index: 2.3 (ref 1.4–3.8)
T3 Uptake: 28 % (ref 22–35)
T4, Total: 8.2 ug/dL (ref 5.1–11.9)
TSH: 1.85 m[IU]/L

## 2023-09-13 ENCOUNTER — Ambulatory Visit
Admission: RE | Admit: 2023-09-13 | Discharge: 2023-09-13 | Disposition: A | Discharge status: Home / Self Care | Payer: Managed Care, Other (non HMO) | Source: Ambulatory Visit | Attending: Family Medicine | Admitting: Family Medicine

## 2023-09-13 DIAGNOSIS — Z1231 Encounter for screening mammogram for malignant neoplasm of breast: Secondary | ICD-10-CM

## 2023-10-29 ENCOUNTER — Ambulatory Visit: Payer: Managed Care, Other (non HMO) | Admitting: Family Medicine

## 2023-11-06 ENCOUNTER — Ambulatory Visit: Payer: Self-pay | Admitting: Family Medicine

## 2023-11-07 ENCOUNTER — Encounter: Payer: Self-pay | Admitting: Family Medicine

## 2023-11-07 ENCOUNTER — Ambulatory Visit (INDEPENDENT_AMBULATORY_CARE_PROVIDER_SITE_OTHER): Payer: Managed Care, Other (non HMO) | Admitting: Family Medicine

## 2023-11-07 VITALS — BP 136/82 | HR 89 | Temp 98.4°F | Ht 62.0 in | Wt 244.0 lb

## 2023-11-07 DIAGNOSIS — M79661 Pain in right lower leg: Secondary | ICD-10-CM | POA: Insufficient documentation

## 2023-11-07 NOTE — Progress Notes (Addendum)
 Subjective:  HPI: [name] is a 53 y.o. female presenting on 11/07/2023 for Acute Visit (leg pain right side woke up and went to get out bed and had a sudden pain, calf and behind the knee)   HPI Patient is in today for right sided calf pain when she was walking through the kitchen overnight Sunday night into Monday morning. The pain is behind her knee. She has tried Tylenol with some improvement. When she woke this AM she was experiencing right calf stiffness and tightness. Her calf is painful when she walks, improved with sitting. There has also been pain in her calf when she tried to push with her foot. Denies swelling, redness, warmth, numbness, tingling, weakness, recent travel or immobilization, surgery. This did happen a few weeks ago more mildly with exercise, she has been more physically active trying to increase her exercise. No history DVT.  Review of Systems  All other systems reviewed and are negative.   Relevant past medical history reviewed and updated as indicated.   Past Medical History:  Diagnosis Date  . Anemia   . Chronic allergic rhinitis   . COVID 01-05-2021 result in epic   fatigue x 3 days  . Elevated blood-pressure reading without diagnosis of hypertension    per pt being monitored by pcp, pt feels gets elevated in doctor office and with anxiety    (11-10-2019 cardiac CT showed normal coronaries and cal score zero;  echo 10-20-2019  ef 55%,G1DD, mild LVH, mild MR)  . GAD (generalized anxiety disorder)   . GERD (gastroesophageal reflux disease)   . Hypothyroidism   . Iron deficiency anemia secondary to blood loss (chronic) hematology/ onocology--- dr Ellin Saba   secondary to uterine fibroids,  treated with oral iron and iron infusions  . Menorrhagia   . Panic attack   . Tinnitus of left ear    chronic  . Uterine fibroid   . Vitamin D deficiency   . Wears glasses      Past Surgical History:  Procedure Laterality Date  . BREAST BIOPSY    .  CERVICAL CERCLAGE    . CESAREAN SECTION    . HYSTEROSCOPY WITH RESECTOSCOPE N/A 02/11/2021   Procedure: HYSTEROSCOPY WITH MYOSURE;  Surgeon: Maxie Better, MD;  Location: [facility];  Service: Gynecology;  Laterality: N/A;    Allergies and medications reviewed and updated.   Current Outpatient Medications:  .  APPLE CIDER VINEGAR PO, Goli apple cider vinegar, Disp: , Rfl:  .  azelastine (ASTELIN) 0.1 % nasal spray, Place 2 sprays into both nostrils 2 (two) times daily. Use in each nostril as directed, Disp: 30 mL, Rfl: 3 .  cetirizine (ZYRTEC) 10 MG tablet, Take 1 tablet (10 mg total) by mouth daily., Disp: 30 tablet, Rfl: 3 .  Cholecalciferol (VITAMIN D3) 50 MCG (2000 UT) CHEW, Chew 2 capsules by mouth daily., Disp: , Rfl:  .  esomeprazole (NEXIUM) 40 MG capsule, Take 40 mg by mouth daily at 12 noon., Disp: , Rfl:  .  fluticasone (FLONASE) 50 MCG/ACT nasal spray, Place 2 sprays into both nostrils daily., Disp: 16 g, Rfl: 3 .  hydrochlorothiazide (HYDRODIURIL) 12.5 MG tablet, Take 1 tablet (12.5 mg total) by mouth daily., Disp: 90 tablet, Rfl: 3 .  influenza vac split quadrivalent PF (FLUARIX) 0.5 ML injection, Inject 0.5 mLs into the muscle., Disp: 0.5 mL, Rfl: 0 .  Insulin Pen Needle (BD PEN NEEDLE NANO U/F) 32G X 4 MM MISC, Use once daily.,  Disp: 100 each, Rfl: 0 .  Lactobacillus Rhamnosus, GG, (CULTURELLE PO), Take 1 tablet by mouth daily., Disp: , Rfl:  .  levothyroxine (SYNTHROID) 25 MCG tablet, Take 1 tablet (25 mcg total) by mouth daily., Disp: 90 tablet, Rfl: 1 .  Multiple Vitamins-Iron (MULTIVITAMINS WITH IRON) TABS tablet, Take 1 tablet by mouth daily., Disp: , Rfl: 0 .  nitrofurantoin, macrocrystal-monohydrate, (MACROBID) 100 MG capsule, Take 1 capsule (100 mg total) by mouth 2 (two) times daily., Disp: 10 capsule, Rfl: 0 .  Olopatadine HCl 0.2 % SOLN, Apply 1 drop to eye daily as needed (itchy watery eyes)., Disp: 2.5 mL, Rfl: 3 .  Olopatadine HCl 0.6 % SOLN,  Place 2 sprays into the nose 2 (two) times daily., Disp: 30.5 g, Rfl: 3 .  Olopatadine-Mometasone 161-09 MCG/ACT SUSP, Place 2 sprays into the nose 2 (two) times daily as needed., Disp: 29 g, Rfl: 5 .  OVER THE COUNTER MEDICATION, 2 beets gummies daily, Disp: , Rfl:  .  OVER THE COUNTER MEDICATION, Tumeric, Disp: , Rfl:  .  OVER THE COUNTER MEDICATION, Rotates allegra,Claritin, and zyrtec, Disp: , Rfl:  .  sertraline (ZOLOFT) 100 MG tablet, Take 1 tablet (100 mg total) by mouth daily., Disp: 90 tablet, Rfl: 0 .  Vitamin D, Ergocalciferol, (DRISDOL) 1.25 MG (50000 UNIT) CAPS capsule, Take 1 capsule (50,000 Units total) by mouth every 7 (seven) days., Disp: 4 capsule, Rfl: 0 .  Zoster Vaccine Adjuvanted (SHINGRIX) injection, Inject 0.5 mLs into the muscle once for 1 dose, Disp: 0.5 mL, Rfl: 1  Allergies  Allergen Reactions  . Pineapple Itching    Objective:   BP 136/82   Pulse 89   Temp 98.4 F (36.9 C) (Oral)   Ht 5\' 2"  (1.575 m)   Wt 244 lb (110.7 kg)   LMP 08/27/2023 (Approximate)   SpO2 98%   BMI 44.63 kg/m      11/07/2023    3:54 PM 07/10/2023   11:53 AM 04/10/2023   12:15 PM  Vitals with BMI  Height 5\' 2"  5\' 2"  5\' 2"   Weight 244 lbs 242 lbs 241 lbs  BMI 44.62 44.25 44.07  Systolic 136 128 604  Diastolic 82 90 80  Pulse 89 84 77     Physical Exam Vitals and nursing note reviewed.  Constitutional:      Appearance: Normal appearance. She is normal weight.  HENT:     Head: Normocephalic and atraumatic.  Musculoskeletal:     Right lower leg: No swelling.     Left lower leg: No swelling.     Right ankle: Normal.     Left ankle: Normal.  Skin:    General: Skin is warm and dry.  Neurological:     General: No focal deficit present.     Mental Status: She is alert and oriented to person, place, and time. Mental status is at baseline.  Psychiatric:        Mood and Affect: Mood normal.        Behavior: Behavior normal.        Thought Content: Thought content normal.         Judgment: Judgment normal.    Assessment & Plan:  Right calf pain Assessment & Plan: No swelling, redness, or warmth on exam however I am concerned about her calf pain while ambulating. Homan's negative in office however she does endorse recent pain when what is described as "pushing with [her] foot). She is fairly sedentary given that she  works from home. Low suspicion for DVT, discussed risks vs benefits and she would like to proceed with DVT rule out.   Orders: -     US Venous Img Lower Unilateral Right (DVT); Future     Follow up plan: Return if symptoms worsen or fail to improve.  Park Meo, FNP

## 2023-11-07 NOTE — Assessment & Plan Note (Addendum)
 No swelling, redness, or warmth on exam however I am concerned about her calf pain while ambulating. Homan's negative in office however she does endorse recent pain when what is described as "pushing with [her] foot). She is fairly sedentary given that she works from home. Low suspicion for DVT, discussed risks vs benefits and she would like to proceed with DVT rule out.

## 2023-11-09 ENCOUNTER — Ambulatory Visit
Admission: RE | Admit: 2023-11-09 | Discharge: 2023-11-09 | Disposition: A | Discharge status: Home / Self Care | Payer: Managed Care, Other (non HMO) | Source: Ambulatory Visit | Attending: Family Medicine | Admitting: Family Medicine

## 2023-11-09 DIAGNOSIS — M79661 Pain in right lower leg: Secondary | ICD-10-CM

## 2023-11-12 ENCOUNTER — Encounter: Payer: Self-pay | Admitting: Family Medicine

## 2023-11-14 ENCOUNTER — Encounter: Payer: Self-pay | Admitting: Family Medicine

## 2023-11-14 ENCOUNTER — Ambulatory Visit: Payer: Managed Care, Other (non HMO) | Admitting: Family Medicine

## 2023-11-14 VITALS — BP 128/84 | HR 97 | Temp 97.8°F | Ht 62.0 in | Wt 244.0 lb

## 2023-11-14 DIAGNOSIS — R1030 Lower abdominal pain, unspecified: Secondary | ICD-10-CM

## 2023-11-14 DIAGNOSIS — M79661 Pain in right lower leg: Secondary | ICD-10-CM

## 2023-11-14 NOTE — Assessment & Plan Note (Addendum)
 [employer]. No red flags. Return to office for new or worsening symptoms.

## 2023-11-14 NOTE — Assessment & Plan Note (Signed)
 Resolved. Suspect gastrocnemius strain. Continue NSAIDs PRN and rest. Increase activity as tolerated.

## 2023-11-14 NOTE — Progress Notes (Signed)
 Subjective:  HPI: [name] is a 53 y.o. female presenting on 11/14/2023 for Acute Visit (lower abdominal pain, dull, wanting to get it looked at thinks it may be due to perimenopausal)   HPI Patient is in today for recent history of lower abdominal pain described as dull. These pains have resolved. She does have PMH of ablation and is having irregular cycles. She was due for her cycle at the time to the lower abdominal pain onset. Denies fever, nausea, vomiting, diarrhea, constipation, changes in bowel patterns, bloody stools.  Today she also reports her calf pain is resolved. She has been using NSAIDs. Continues to deny swelling, redness, or warmth. [name] is wearing compression stockings and resting the affected leg.  Review of Systems  All other systems reviewed and are negative.   Relevant past medical history reviewed and updated as indicated.   Past Medical History:  Diagnosis Date   Anemia    Chronic allergic rhinitis    COVID 01-05-2021 result in epic   fatigue x 3 days   Elevated blood-pressure reading without diagnosis of hypertension    per pt being monitored by pcp, pt feels gets elevated in doctor office and with anxiety    (11-10-2019 cardiac CT showed normal coronaries and cal score zero;  echo 10-20-2019  ef 55%,G1DD, mild LVH, mild MR)   GAD (generalized anxiety disorder)    GERD (gastroesophageal reflux disease)    Hypothyroidism    Iron deficiency anemia secondary to blood loss (chronic) hematology/ onocology--- dr Ellin Saba   secondary to uterine fibroids,  treated with oral iron and iron infusions   Menorrhagia    Panic attack    Tinnitus of left ear    chronic   Uterine fibroid    Vitamin D deficiency    Wears glasses      Past Surgical History:  Procedure Laterality Date   BREAST BIOPSY     CERVICAL CERCLAGE     CESAREAN SECTION     HYSTEROSCOPY WITH RESECTOSCOPE N/A 02/11/2021   Procedure: HYSTEROSCOPY WITH MYOSURE;  Surgeon: Maxie Better, MD;  Location: [facility];  Service: Gynecology;  Laterality: N/A;    Allergies and medications reviewed and updated.   Current Outpatient Medications:    APPLE CIDER VINEGAR PO, Goli apple cider vinegar, Disp: , Rfl:    azelastine (ASTELIN) 0.1 % nasal spray, Place 2 sprays into both nostrils 2 (two) times daily. Use in each nostril as directed, Disp: 30 mL, Rfl: 3   cetirizine (ZYRTEC) 10 MG tablet, Take 1 tablet (10 mg total) by mouth daily., Disp: 30 tablet, Rfl: 3   Cholecalciferol (VITAMIN D3) 50 MCG (2000 UT) CHEW, Chew 2 capsules by mouth daily., Disp: , Rfl:    esomeprazole (NEXIUM) 40 MG capsule, Take 40 mg by mouth daily at 12 noon., Disp: , Rfl:    fluticasone (FLONASE) 50 MCG/ACT nasal spray, Place 2 sprays into both nostrils daily., Disp: 16 g, Rfl: 3   hydrochlorothiazide (HYDRODIURIL) 12.5 MG tablet, Take 1 tablet (12.5 mg total) by mouth daily., Disp: 90 tablet, Rfl: 3   influenza vac split quadrivalent PF (FLUARIX) 0.5 ML injection, Inject 0.5 mLs into the muscle., Disp: 0.5 mL, Rfl: 0   Lactobacillus Rhamnosus, GG, (CULTURELLE PO), Take 1 tablet by mouth daily., Disp: , Rfl:    levothyroxine (SYNTHROID) 25 MCG tablet, Take 1 tablet (25 mcg total) by mouth daily., Disp: 90 tablet, Rfl: 1   Multiple Vitamins-Iron (MULTIVITAMINS WITH IRON)  TABS tablet, Take 1 tablet by mouth daily., Disp: , Rfl: 0   nitrofurantoin, macrocrystal-monohydrate, (MACROBID) 100 MG capsule, Take 1 capsule (100 mg total) by mouth 2 (two) times daily., Disp: 10 capsule, Rfl: 0   Olopatadine HCl 0.2 % SOLN, Apply 1 drop to eye daily as needed (itchy watery eyes)., Disp: 2.5 mL, Rfl: 3   Olopatadine HCl 0.6 % SOLN, Place 2 sprays into the nose 2 (two) times daily., Disp: 30.5 g, Rfl: 3   Olopatadine-Mometasone 665-25 MCG/ACT SUSP, Place 2 sprays into the nose 2 (two) times daily as needed., Disp: 29 g, Rfl: 5   OVER THE COUNTER MEDICATION, 2 beets gummies daily, Disp: , Rfl:     OVER THE COUNTER MEDICATION, Tumeric, Disp: , Rfl:    OVER THE COUNTER MEDICATION, Rotates allegra,Claritin, and zyrtec, Disp: , Rfl:    sertraline (ZOLOFT) 100 MG tablet, Take 1 tablet (100 mg total) by mouth daily., Disp: 90 tablet, Rfl: 0   Vitamin D, Ergocalciferol, (DRISDOL) 1.25 MG (50000 UNIT) CAPS capsule, Take 1 capsule (50,000 Units total) by mouth every 7 (seven) days., Disp: 4 capsule, Rfl: 0   Zoster Vaccine Adjuvanted (SHINGRIX) injection, Inject 0.5 mLs into the muscle once for 1 dose, Disp: 0.5 mL, Rfl: 1   Insulin Pen Needle (BD PEN NEEDLE NANO U/F) 32G X 4 MM MISC, Use once daily. (Patient not taking: Reported on 11/14/2023), Disp: 100 each, Rfl: 0  Allergies  Allergen Reactions   Pineapple Itching    Objective:   BP 128/84   Pulse 97   Temp 97.8 F (36.6 C) (Oral)   Ht 5\' 2"  (1.575 m)   Wt 244 lb (110.7 kg)   LMP 08/27/2023 (Approximate)   SpO2 98%   BMI 44.63 kg/m      11/14/2023    2:57 PM 11/07/2023    3:54 PM 07/10/2023   11:53 AM  Vitals with BMI  Height 5\' 2"  5\' 2"  5\' 2"   Weight 244 lbs 244 lbs 242 lbs  BMI 44.62 44.62 44.25  Systolic 128 136 147  Diastolic 84 82 90  Pulse 97 89 84     Physical Exam Vitals and nursing note reviewed.  Constitutional:      Appearance: Normal appearance. She is normal weight.  HENT:     Head: Normocephalic and atraumatic.  Pulmonary:     Effort: Pulmonary effort is normal.     Breath sounds: Normal breath sounds.  Abdominal:     General: Bowel sounds are normal. There is no distension.     Palpations: Abdomen is soft.     Tenderness: There is no abdominal tenderness.  Skin:    General: Skin is warm and dry.  Neurological:     General: No focal deficit present.     Mental Status: She is alert and oriented to person, place, and time. Mental status is at baseline.  Psychiatric:        Mood and Affect: Mood normal.        Behavior: Behavior normal.        Thought Content: Thought content normal.         Judgment: Judgment normal.     Assessment & Plan:  Lower abdominal pain Assessment & Plan: [facility]. No red flags. Return to office for new or worsening symptoms.   Right calf pain Assessment & Plan: Resolved. Suspect gastrocnemius strain. Continue NSAIDs PRN and rest. Increase activity as tolerated.       Follow up plan: Return  in about 8 months (around 07/10/2024) for PAP and annual physical with labs 1 week prior.  Park Meo, FNP

## 2023-12-29 ENCOUNTER — Other Ambulatory Visit: Payer: Self-pay | Admitting: Family Medicine

## 2023-12-31 ENCOUNTER — Other Ambulatory Visit: Payer: Managed Care, Other (non HMO)

## 2024-01-07 ENCOUNTER — Ambulatory Visit: Payer: Managed Care, Other (non HMO) | Admitting: Family Medicine

## 2024-01-08 ENCOUNTER — Ambulatory Visit: Payer: Managed Care, Other (non HMO) | Admitting: Family Medicine

## 2024-03-11 DIAGNOSIS — Z9989 Dependence on other enabling machines and devices: Secondary | ICD-10-CM

## 2024-03-11 HISTORY — DX: Dependence on other enabling machines and devices: Z99.89

## 2024-07-11 ENCOUNTER — Other Ambulatory Visit

## 2024-07-11 DIAGNOSIS — Z0000 Encounter for general adult medical examination without abnormal findings: Secondary | ICD-10-CM

## 2024-07-14 ENCOUNTER — Encounter (HOSPITAL_COMMUNITY): Payer: Self-pay | Admitting: Hematology and Oncology

## 2024-07-14 ENCOUNTER — Ambulatory Visit: Admitting: Family Medicine

## 2024-07-14 ENCOUNTER — Encounter: Payer: Self-pay | Admitting: Family Medicine

## 2024-07-14 ENCOUNTER — Telehealth: Payer: Self-pay | Admitting: Pharmacy Technician

## 2024-07-14 ENCOUNTER — Other Ambulatory Visit (HOSPITAL_COMMUNITY): Payer: Self-pay

## 2024-07-14 VITALS — BP 124/82 | HR 88 | Temp 98.1°F | Ht 62.0 in | Wt 246.6 lb

## 2024-07-14 DIAGNOSIS — F411 Generalized anxiety disorder: Secondary | ICD-10-CM

## 2024-07-14 DIAGNOSIS — Z0001 Encounter for general adult medical examination with abnormal findings: Secondary | ICD-10-CM

## 2024-07-14 DIAGNOSIS — Z124 Encounter for screening for malignant neoplasm of cervix: Secondary | ICD-10-CM

## 2024-07-14 DIAGNOSIS — Z0000 Encounter for general adult medical examination without abnormal findings: Secondary | ICD-10-CM

## 2024-07-14 DIAGNOSIS — I10 Essential (primary) hypertension: Secondary | ICD-10-CM | POA: Diagnosis not present

## 2024-07-14 DIAGNOSIS — J309 Allergic rhinitis, unspecified: Secondary | ICD-10-CM

## 2024-07-14 DIAGNOSIS — G4733 Obstructive sleep apnea (adult) (pediatric): Secondary | ICD-10-CM | POA: Diagnosis not present

## 2024-07-14 DIAGNOSIS — E038 Other specified hypothyroidism: Secondary | ICD-10-CM

## 2024-07-14 DIAGNOSIS — K219 Gastro-esophageal reflux disease without esophagitis: Secondary | ICD-10-CM

## 2024-07-14 DIAGNOSIS — E559 Vitamin D deficiency, unspecified: Secondary | ICD-10-CM

## 2024-07-14 MED ORDER — LEVOTHYROXINE SODIUM 25 MCG PO TABS: 25.0000 ug | ORAL_TABLET | Freq: Every day | ORAL | 1 refills | Status: AC

## 2024-07-14 MED ORDER — SERTRALINE HCL 100 MG PO TABS: 100.0000 mg | ORAL_TABLET | Freq: Every day | ORAL | 0 refills | Status: DC

## 2024-07-14 MED ORDER — ZEPBOUND 2.5 MG/0.5ML ~~LOC~~ SOAJ: 2.5000 mg | SUBCUTANEOUS | 0 refills | Status: AC

## 2024-07-14 MED ORDER — MONTELUKAST SODIUM 10 MG PO TABS: 10.0000 mg | ORAL_TABLET | Freq: Every day | ORAL | 1 refills | Status: AC

## 2024-07-14 NOTE — Telephone Encounter (Signed)
 Pharmacy Patient Advocate Encounter   Received notification from CoverMyMeds that prior authorization for Zepbound 2.5MG /0.5ML pen-injectors is required/requested.   Insurance verification completed.   The patient is insured through [employer].   Per test claim: Per test claim, medication is not covered due to plan/benefit exclusion, PA not submitted at this time

## 2024-07-14 NOTE — Progress Notes (Signed)
 Subjective:   [name] is a 53 y.o. female for annual routine Pap and checkup. PMH includes GERD, HTN, hypothyroidism.  HTN: on hydrochlorothiazide  12.5mg  daily Denies chest pain, palpitations, recurrent headaches, vision changes, lightheadedness, dizziness, dyspnea on exertion, or swelling of extremities.  Hypothyroidism: on Synthroid  25mcg daily  GERD: on Nexium  40mg  daily Denies hematemesis, melena, hematochezia, occult blood in stool, iron  deficiency anemia, anorexia, unexplained weight loss, dysphagia, odynophagia, persistent vomiting  Allergies: followed by allergy , taking zyrtec , flonase ; uncontrolled  OSA: on CPAP, recent sleep study  Discussed the use of AI scribe software for clinical note transcription with the patient, who gave verbal consent to proceed.  History of Present Illness [name] is a 53 year old female who presents for a follow-up visit.  She experiences persistent pressure in her ears, which she attributes to allergies. [place] wor[place]. She has been using Zyrtec  and occasionally Sudafed, but these have not been effective. She has not tried sinus medications like Mucinex or Flonase  recently. No symptoms of cold, sore throat, or other signs of illness.  She has osteoarthritis in her knees, as diagnosed by her previous clinician. A steroid injection in her knee provided some relief, but she continues to experience pain. Physical and water therapy were beneficial, but insurance issues have limited her access. She is managing her condition with home exercises.  She has mild sleep apnea and uses a CPAP machine, which has improved her energy levels. However, she has not been able to use it consistently due to ear pressure. She underwent a sleep study earlier this year, but the results are not currently available.  She experiences anxiety and has a history of panic attacks following the loss of her son.  She was previously prescribed Zoloft  for anxiety but has run out of the medication. She needs a refill to manage her symptoms.  She takes levothyroxine  for thyroid  management but is currently out of the medication and requires a refill. She also takes Nexium  for reflux management.  No vaginal symptoms, bleeding, or cramping recently. Her last menstrual cycle was in February, and she has not had any since then.    Current Outpatient Medications  Medication Sig Dispense Refill   cetirizine  (ZYRTEC ) 10 MG tablet Take 1 tablet (10 mg total) by mouth daily. 30 tablet 3   Cholecalciferol (VITAMIN D3) 50 MCG (2000 UT) CHEW Chew 2 capsules by mouth daily.     esomeprazole  (NEXIUM ) 40 MG capsule Take 40 mg by mouth daily at 12 noon.     fluticasone  (FLONASE ) 50 MCG/ACT nasal spray Place 2 sprays into both nostrils daily. 16 g 3   hydrochlorothiazide  (HYDRODIURIL ) 12.5 MG tablet TAKE 1 TABLET BY MOUTH DAILY 90 tablet 3   Lactobacillus Rhamnosus, GG, (CULTURELLE PO) Take 1 tablet by mouth daily.     montelukast  (SINGULAIR ) 10 MG tablet Take 1 tablet (10 mg total) by mouth at bedtime. 90 tablet 1   Multiple Vitamins-Iron  (MULTIVITAMINS WITH IRON ) TABS tablet Take 1 tablet by mouth daily.  0   tirzepatide (ZEPBOUND) 2.5 MG/0.5ML Pen Inject 2.5 mg into the skin once a week. 2 mL 0   Vitamin D , Ergocalciferol , (DRISDOL ) 1.25 MG (50000 UNIT) CAPS capsule Take 1 capsule (50,000 Units total) by mouth every 7 (seven) days. 4 capsule 0   APPLE CIDER VINEGAR PO [place] apple cider vinegar (Patient not taking: Reported on 07/14/2024)     azelastine  (ASTELIN ) 0.1 % nasal spray  Place 2 sprays into both nostrils 2 (two) times daily. Use in each nostril as directed (Patient not taking: Reported on 07/14/2024) 30 mL 3   influenza vac split quadrivalent PF (FLUARIX) 0.5 ML injection Inject 0.5 mLs into the muscle. (Patient not taking: Reported on 07/14/2024) 0.5 mL 0   Insulin  Pen Needle (BD PEN NEEDLE NANO U/F) 32G X 4 MM  MISC Use once daily. (Patient not taking: Reported on 07/14/2024) 100 each 0   levothyroxine  (SYNTHROID ) 25 MCG tablet Take 1 tablet (25 mcg total) by mouth daily. 90 tablet 1   nitrofurantoin , macrocrystal-monohydrate, (MACROBID ) 100 MG capsule Take 1 capsule (100 mg total) by mouth 2 (two) times daily. (Patient not taking: Reported on 07/14/2024) 10 capsule 0   Olopatadine  HCl 0.2 % SOLN Apply 1 drop to eye daily as needed (itchy watery eyes). (Patient not taking: Reported on 07/14/2024) 2.5 mL 3   Olopatadine  HCl 0.6 % SOLN Place 2 sprays into the nose 2 (two) times daily. 30.5 g 3   Olopatadine -Mometasone  665-25 MCG/ACT SUSP Place 2 sprays into the nose 2 (two) times daily as needed. (Patient not taking: Reported on 07/14/2024) 29 g 5   OVER THE COUNTER MEDICATION 2 beets gummies daily     OVER THE COUNTER MEDICATION Tumeric     OVER THE COUNTER MEDICATION Rotates allegra,Claritin, and zyrtec      sertraline  (ZOLOFT ) 100 MG tablet Take 1 tablet (100 mg total) by mouth daily. 90 tablet 0   Zoster Vaccine Adjuvanted (SHINGRIX ) injection Inject 0.5 mLs into the muscle once for 1 dose (Patient not taking: Reported on 07/14/2024) 0.5 mL 1   No current facility-administered medications for this visit.   Allergies: Pineapple  Patient's last menstrual period was 10/13/2023 (approximate). Past Medical History:  Diagnosis Date   Anemia    Chronic allergic rhinitis    COVID 01-05-2021 result in epic   fatigue x 3 days   CPAP (continuous positive airway pressure) dependence 03/11/2024   Elevated blood-pressure reading without diagnosis of hypertension    per pt being monitored by pcp, pt feels gets elevated in doctor office and with anxiety    (11-10-2019 cardiac CT showed normal coronaries and cal score zero;  echo 10-20-2019  ef 55%,G1DD, mild LVH, mild MR)   GAD (generalized anxiety disorder)    GERD (gastroesophageal reflux disease)    Hypothyroidism    Iron  deficiency anemia secondary to blood loss  (chronic) hematology/ onocology--- dr rogers   secondary to uterine fibroids,  treated with oral iron  and iron  infusions   Menorrhagia    Panic attack    Tinnitus of left ear    chronic   Uterine fibroid    Vitamin D  deficiency    Wears glasses    Past Surgical History:  Procedure Laterality Date   BREAST BIOPSY     CERVICAL CERCLAGE     CESAREAN SECTION     HYSTEROSCOPY WITH RESECTOSCOPE N/A 02/11/2021   Procedure: HYSTEROSCOPY WITH MYOSURE;  Surgeon: Rutherford Gain, MD;  Location: [facility];  Service: Gynecology;  Laterality: N/A;     ROS:  Feeling well. No dyspnea or chest pain on exertion.  No abdominal pain, change in bowel habits, black or bloody stools.  No urinary tract symptoms. GYN ROS: no breast pain or new or enlarging lumps on self exam, no vaginal bleeding, no hot flashes. No neurological complaints.  Objective:   The patient appears well, alert, oriented x 3, in no distress. BP 124/82  Pulse 88   Temp 98.1 F (36.7 C)   Ht 5' 2 (1.575 m)   Wt 246 lb 9.6 oz (111.9 kg)   LMP 10/13/2023 (Approximate)   SpO2 99%   BMI 45.10 kg/m  ENT normal.  Neck supple. No adenopathy or thyromegaly. PERLA. Lungs are clear, good air entry, no wheezes, rhonchi or rales. S1 and S2 normal, no murmurs, regular rate and rhythm. Abdomen soft without tenderness, guarding, mass or organomegaly. Extremities show no edema, normal peripheral pulses. Neurological is normal, no focal findings.  BREAST EXAM: breasts appear normal, no suspicious masses, no skin or nipple changes or axillary nodes  PELVIC EXAM: normal external genitalia, vulva, vagina, cervix, uterus and adnexa, PAP: Pap smear done today, exam chaperoned by Jesusa Bustle  Assessment & Plan:   well woman  PLAN:  mammogram pap smear return annually or prn    Physical exam, annual  Obstructive sleep apnea -     Zepbound; Inject 2.5 mg into the skin once a week.  Dispense: 2 mL; Refill:  0  Cervical cancer screening -     Pap, TP Imaging w/ CT/GC and w/ HPV RNA, rflx HPV Type 16/18  Primary hypertension  Chronic allergic rhinitis  Gastroesophageal reflux disease without esophagitis  Other specified hypothyroidism  Morbid obesity (HCC)  GAD (generalized anxiety disorder)  Vitamin D  deficiency  Other orders -     Levothyroxine  Sodium; Take 1 tablet (25 mcg total) by mouth daily.  Dispense: 90 tablet; Refill: 1 -     Montelukast  Sodium; Take 1 tablet (10 mg total) by mouth at bedtime.  Dispense: 90 tablet; Refill: 1 -     Sertraline  HCl; Take 1 tablet (100 mg total) by mouth daily.  Dispense: 90 tablet; Refill: 0   Assessment and Plan Assessment & Plan Adult Wellness Visit Routine wellness visit with normal labs. Possible menopause indicated by absence of menstrual cycle since February. - Schedule annual mammogram. - Continue annual wellness visits. - Working with weight loss clinic  Obstructive sleep apnea Mild obstructive sleep apnea with inconsistent CPAP use due to ear pressure. Discussed Zepbound for weight loss to aid management. - Ordered updated sleep study. - Attempted to authorize Zepbound for OSA. - denies history of pancreatitis and personal or family history of MEN2 or MTC - Signed release for sleep study records from [facility].  Allergic rhinitis with persistent nasal and ear pressure symptoms Persistent symptoms likely due to allergies. Current medications include Zyrtec  and Flonase . Discussed potential medication changes and immunotherapy. - Continue Zyrtec  and Flonase . - Restart Singulair  - Consider Sudafed or Mucinex for additional relief. - Consulted allergy  specialist for potential immunotherapy.  Knee osteoarthritis Pain limits physical activity. Previous steroid injection provided temporary relief. Physical therapy and water therapy beneficial. - Continue home exercises. - Will consider further physical therapy if insurance  allows.  Hypothyroidism Managed with levothyroxine . Prescription running low. - Refilled levothyroxine  prescription.  Gastroesophageal reflux disease (GERD) GERD managed with Nexium . No new symptoms reported. - Continue Nexium .  Depression and anxiety symptoms Managed with Zoloft . Symptoms include anxiety and panic attacks. Prescription running low. - Refilled Zoloft  prescription.  Vitamin D  deficiency Labs indicate low vitamin D  levels. - Continue vitamin D  supplementation. - Monitor vitamin D  levels and adjust dose if necessary.    Follow up plan: Return in about 6 months (around 01/11/2025) for chronic follow-up with labs 1 week prior.  Jeoffrey GORMAN Barrio, FNP

## 2024-07-15 ENCOUNTER — Ambulatory Visit: Payer: Self-pay | Admitting: Family Medicine

## 2024-07-15 LAB — LIPID PANEL
Cholesterol: 187 mg/dL (ref <200)
HDL: 70 mg/dL (ref >=50)
LDL Cholesterol (Calc): 97 mg/dL
Non-HDL Cholesterol (Calc): 117 mg/dL (ref <130)
Total CHOL/HDL Ratio: 2.7 (calc) (ref <5.0)
Triglycerides: 105 mg/dL (ref <150)

## 2024-07-15 LAB — CBC WITH DIFFERENTIAL/PLATELET
Absolute Lymphocytes: 2726 {cells}/uL (ref 850–3900)
Absolute Monocytes: 640 {cells}/uL (ref 200–950)
Basophils Absolute: 51 {cells}/uL (ref 0–200)
Basophils Relative: 0.8 %
Eosinophils Absolute: 160 {cells}/uL (ref 15–500)
Eosinophils Relative: 2.5 %
HCT: 40.8 % (ref 35.0–45.0)
Hemoglobin: 14 g/dL (ref 11.7–15.5)
MCH: 30.6 pg (ref 27.0–33.0)
MCHC: 34.3 g/dL (ref 32.0–36.0)
MCV: 89.1 fL (ref 80.0–100.0)
MPV: 10.6 fL (ref 7.5–12.5)
Monocytes Relative: 10 %
Neutro Abs: 2822 {cells}/uL (ref 1500–7800)
Neutrophils Relative %: 44.1 %
Platelets: 313 Thousand/uL (ref 140–400)
RBC: 4.58 Million/uL (ref 3.80–5.10)
RDW: 12.8 % (ref 11.0–15.0)
Total Lymphocyte: 42.6 %
WBC: 6.4 Thousand/uL (ref 3.8–10.8)

## 2024-07-15 LAB — COMPREHENSIVE METABOLIC PANEL WITH GFR
AG Ratio: 1.4 (calc) (ref 1.0–2.5)
ALT: 29 U/L (ref 6–29)
AST: 20 U/L (ref 10–35)
Albumin: 4.3 g/dL (ref 3.6–5.1)
Alkaline phosphatase (APISO): 57 U/L (ref 37–153)
BUN: 14 mg/dL (ref 7–25)
CO2: 27 mmol/L (ref 20–32)
Calcium: 9.7 mg/dL (ref 8.6–10.4)
Chloride: 103 mmol/L (ref 98–110)
Creat: 0.9 mg/dL (ref 0.50–1.03)
Globulin: 3 g/dL (ref 1.9–3.7)
Glucose, Bld: 79 mg/dL (ref 65–99)
Potassium: 3.6 mmol/L (ref 3.5–5.3)
Sodium: 141 mmol/L (ref 135–146)
Total Bilirubin: 0.6 mg/dL (ref 0.2–1.2)
Total Protein: 7.3 g/dL (ref 6.1–8.1)
eGFR: 77 mL/min/1.73m2 (ref >=60)

## 2024-07-15 LAB — TEST AUTHORIZATION

## 2024-07-15 LAB — TSH: TSH: 1.62 m[IU]/L

## 2024-07-15 LAB — VITAMIN D 25 HYDROXY (VIT D DEFICIENCY, FRACTURES): Vit D, 25-Hydroxy: 45 ng/mL (ref 30–100)

## 2024-07-15 NOTE — Telephone Encounter (Signed)
 Pt. Has been notified by phone. She is not interested in moving forward with the current self pay charge

## 2024-07-18 LAB — PAP, TP IMAGING W/ HPV RNA, RFLX HPV TYPE 16,18/45: HPV DNA High Risk: NOT DETECTED

## 2024-07-18 LAB — C. TRACHOMATIS/N. GONORRHOEAE RNA
C. trachomatis RNA, TMA: NOT DETECTED
N. gonorrhoeae RNA, TMA: NOT DETECTED

## 2024-07-18 LAB — PAP, TP IMAGING W/ CT/GC AND W/ HPV RNA, RFLX HPV TYPE 16/18

## 2024-07-20 ENCOUNTER — Ambulatory Visit: Payer: Self-pay | Admitting: Family Medicine

## 2024-10-09 ENCOUNTER — Other Ambulatory Visit: Payer: Self-pay | Admitting: Family Medicine

## 2024-10-16 ENCOUNTER — Telehealth: Payer: Self-pay

## 2024-10-16 NOTE — Telephone Encounter (Signed)
 [facility] FAXED INPROVIDER COMMUNICATION ON 10/10/2024    STATING THEY ARE NO LONGER ABLE TO ORDER THE [employer]AND LEVOTHYROXINE  PRODUCTS. THE NEW MANUFACTURER CHOSEN FOR LEVOTHYROXINE  PRODUCTS IS [employer]. [employer] IS ONE OF VERY FEW GENERIC MANUFACTURES THAT HAS ALL AB RATINGS TO COVER ALL THE BRANDED LEVOTHYROXINE  INTERCHANGES, AB1-AB4.   WE ARE REACHING OUT TO SEEK YOUR AUTHORIZATION TO SUBSTITUTE LEVOTHYROXIEN PRESCRIPTIONS FOR ALL OF YOUR PATIENTS THAT FILL AT OUR PHARMACY  TO THE [employer] [facility] MANUFACTURER.   ORDERS REVIEWED AND SIGNED BY PROVIDER AMBER HOWARD ON 10/15/2024 FAXED TO [facility] ON 10/16/2024  PHONE [phone] FAX : [phone] BY CMA, SRP
